# Patient Record
Sex: Male | Born: 1975 | Race: White | Hispanic: No | Marital: Married | Smoking: Former smoker
Health system: Southern US, Community
[De-identification: ages and names within clinical notes are randomized; demographics above are authoritative.]

## PROBLEM LIST (undated history)

## (undated) DIAGNOSIS — E0590 Thyrotoxicosis, unspecified without thyrotoxic crisis or storm: Secondary | ICD-10-CM

## (undated) DIAGNOSIS — J45990 Exercise induced bronchospasm: Secondary | ICD-10-CM

## (undated) DIAGNOSIS — A6920 Lyme disease, unspecified: Secondary | ICD-10-CM

## (undated) DIAGNOSIS — K589 Irritable bowel syndrome without diarrhea: Secondary | ICD-10-CM

## (undated) DIAGNOSIS — I4891 Unspecified atrial fibrillation: Secondary | ICD-10-CM

## (undated) DIAGNOSIS — G4730 Sleep apnea, unspecified: Secondary | ICD-10-CM

## (undated) HISTORY — DX: Irritable bowel syndrome, unspecified: K58.9

## (undated) HISTORY — PX: OTHER SURGICAL HISTORY: SHX169

## (undated) HISTORY — DX: Exercise induced bronchospasm: J45.990

## (undated) HISTORY — DX: Thyrotoxicosis, unspecified without thyrotoxic crisis or storm: E05.90

## (undated) HISTORY — DX: Sleep apnea, unspecified: G47.30

---

## 2011-08-25 ENCOUNTER — Ambulatory Visit (INDEPENDENT_AMBULATORY_CARE_PROVIDER_SITE_OTHER): Payer: BC Managed Care – PPO

## 2011-08-25 DIAGNOSIS — J029 Acute pharyngitis, unspecified: Secondary | ICD-10-CM

## 2011-11-25 ENCOUNTER — Ambulatory Visit: Payer: BC Managed Care – PPO

## 2011-12-12 ENCOUNTER — Ambulatory Visit (INDEPENDENT_AMBULATORY_CARE_PROVIDER_SITE_OTHER): Payer: BC Managed Care – PPO | Admitting: Family Medicine

## 2011-12-12 VITALS — BP 124/79 | HR 61 | Temp 97.6°F | Resp 18 | Ht 69.5 in | Wt 192.6 lb

## 2011-12-12 DIAGNOSIS — L989 Disorder of the skin and subcutaneous tissue, unspecified: Secondary | ICD-10-CM

## 2011-12-12 LAB — POCT SKIN KOH: Skin KOH, POC: NEGATIVE

## 2011-12-12 MED ORDER — CLOTRIMAZOLE-BETAMETHASONE 1-0.05 % EX CREA: TOPICAL_CREAM | Freq: Two times a day (BID) | CUTANEOUS | Status: DC

## 2011-12-12 NOTE — Progress Notes (Signed)
  Subjective:    Patient ID: Noah Bell, male    DOB: 10/20/1975, 36 y.o.   MRN: [id number]  HPI 36 yo male with spot on lower, outer right leg.  There for several months, enlarged recently.  Even noticing a new spot just proximal to it.  Initially started very small like a bite.  Very itchy.  Has tried tea tree oil, hydrocortisone cream, and lamisil.  All make it tingle/burn at first.  Lamisil helped the itch, though it did enlarge after he used it.  Works at [employer].   Review of Systems    Negative except as per HPI  Objective:   Physical Exam  Constitutional: He appears well-developed and well-nourished.  Pulmonary/Chest: Effort normal.  Neurological: He is alert.  Skin:          1 - red, circular, flat lesion with some central clearing.  Dry, scaly skin.  Very small spot of similar appearance - red and scaly, just proximal.     Results for orders placed in visit on 12/12/11  POCT SKIN KOH      Component Value Range   Skin KOH, POC Negative           Assessment & Plan:  Skin lesion - fungal vs eczematous.  Recent use of both hydrocortisone and lamisil may have altered appearance and testing.  Try lotrisone.  If no resolution in 7-10 days - to dermatology.

## 2012-01-14 DIAGNOSIS — A6920 Lyme disease, unspecified: Secondary | ICD-10-CM

## 2012-01-14 HISTORY — DX: Lyme disease, unspecified: A69.20

## 2012-01-18 ENCOUNTER — Telehealth: Payer: Self-pay

## 2012-01-18 DIAGNOSIS — L989 Disorder of the skin and subcutaneous tissue, unspecified: Secondary | ICD-10-CM

## 2012-01-18 NOTE — Telephone Encounter (Signed)
Pt would like to talk with someone about recent visit and if not better schedule a referral with a dermatologist  Best number [phone]

## 2012-01-18 NOTE — Telephone Encounter (Signed)
Yes, if he is no better we should refer to dermatology. Is this what he would like [place] to do?

## 2012-01-19 NOTE — Telephone Encounter (Signed)
Spoke w/pt and the lesion has not significantly improved and he has continued to use the cream we Rxd. Pt would like referral to dermatologist. Starting referral per Heather's note.

## 2012-01-28 ENCOUNTER — Ambulatory Visit: Payer: BC Managed Care – PPO | Admitting: Physician Assistant

## 2012-01-28 VITALS — BP 131/85 | HR 94 | Temp 98.4°F | Resp 16 | Ht 69.5 in | Wt 193.0 lb

## 2012-01-28 DIAGNOSIS — L989 Disorder of the skin and subcutaneous tissue, unspecified: Secondary | ICD-10-CM

## 2012-01-28 DIAGNOSIS — R21 Rash and other nonspecific skin eruption: Secondary | ICD-10-CM

## 2012-01-28 LAB — POCT CBC
Granulocyte percent: 54.5 %G (ref 37–80)
HCT, POC: 45.7 % (ref 43.5–53.7)
MCV: 86.9 fL (ref 80–97)
MID (cbc): 0.7 (ref 0–0.9)
Platelet Count, POC: 212 10*3/uL (ref 142–424)
RBC: 5.26 M/uL (ref 4.69–6.13)

## 2012-01-28 MED ORDER — DOXYCYCLINE HYCLATE 100 MG PO TABS: 100.0000 mg | ORAL_TABLET | Freq: Two times a day (BID) | ORAL | Status: AC

## 2012-01-28 NOTE — Progress Notes (Signed)
  Subjective:    Patient ID: Noah Bell, male    DOB: 10/21/1975, 36 y.o.   MRN: [id number]  HPI Noah Bell comes in today complaining of a rash or bug bite on his right shoulder that he noticed 6 days ago.  Initially when he noticed lesion it was red and the size of a dime.  The central red area decreased in size but he noticed a large thin red ring around central portion today.  He does not remember being bitten by an insect and denies tick exposure.  He has had no fever or chills, nausea or vomiting, myalgias, arthralgias or HA.  He says that the rash is slightly itchy.   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 Vital signs are normal. He appears well-developed and well-nourished.  Eyes: No scleral icterus.  Cardiovascular: Normal rate and regular rhythm.   Pulmonary/Chest: Effort normal and breath sounds normal.  Lymphadenopathy:    He has no cervical adenopathy.    He has no axillary adenopathy.  Neurological: He is alert and oriented to person, place, and time.  Skin:          See photo       Results for orders placed in visit on 01/28/12  POCT CBC      Component Value Range   WBC 6.0  4.6 - 10.2 K/uL   Lymph, poc 2.0  0.6 - 3.4   POC LYMPH PERCENT 34.0  10 - 50 %L   MID (cbc) 0.7  0 - 0.9   POC MID % 11.5  0 - 12 %M   POC Granulocyte 3.3  2 - 6.9   Granulocyte percent 54.5  37 - 80 %G   RBC 5.26  4.69 - 6.13 M/uL   Hemoglobin 14.6  14.1 - 18.1 g/dL   HCT, POC 29.5  28.4 - 53.7 %   MCV 86.9  80 - 97 fL   MCH, POC 27.8  27 - 31.2 pg   MCHC 31.9  31.8 - 35.4 g/dL   RDW, POC 13.2     Platelet Count, POC 212  142 - 424 K/uL   MPV 8.8  0 - 99.8 fL       Assessment & Plan:  Rash, ? Insect bite vs Erythema Migrans  Check Lyme titer Start Doxy 100 Handout given for red flag symptoms.

## 2012-01-28 NOTE — Patient Instructions (Addendum)
Lyme Disease  You may have been bitten by a tick and are to watch for the development of Lyme Disease. Lyme Disease is an infection that is caused by a bacteria The bacteria causing this disease is named Borreilia burgdorferi. If a tick is infected with this bacteria and then bites you, then Lyme Disease may occur. These ticks are carried by deer and rodents such as rabbits and mice and infest grassy as well as forested areas. Fortunately most tick bites do not cause Lyme Disease.   Lyme Disease is easier to prevent than to treat. First, covering your legs with clothing when walking in areas where ticks are possibly abundant will prevent their attachment because ticks tend to stay within inches of the ground. Second, using insecticides containing DEET can be applied on skin or clothing. Last, because it takes about 12 to 24 hours for the tick to transmit the disease after attachment to the human host, you should inspect your body for ticks twice a day when you are in areas where Lyme Disease is common. You must look thoroughly when searching for ticks. The Ixodes tick that carries Lyme Disease is very small. It is around the size of a sesame seed (picture of tick is not actual size). Removal is best done by grasping the tick by the head and pulling it out. Do not to squeeze the body of the tick. This could inject the infecting bacteria into the bite site. Wash the area of the bite with an antiseptic solution after removal.   Lyme Disease is a disease that may affect many body systems. Because of the small size of the biting tick, most people do not notice being bitten. The first sign of an infection is usually a round red rash that extends out from the center of the tick bite. The center of the lesion may be blood colored (hemorrhagic) or have tiny blisters (vesicular). Most lesions have bright red outer borders and partial central clearing. This rash may extend out many inches in diameter, and multiple lesions may  be present. Other symptoms such as fatigue, headaches, chills and fever, general achiness and swelling of lymph glands may also occur. If this first stage of the disease is left untreated, these symptoms may gradually resolve by themselves, or progressive symptoms may occur because of spread of infection to other areas of the body.   Follow up with your caregiver to have testing and treatment if you have a tick bite and you develop any of the above complaints. Your caregiver may recommend preventative (prophylactic) medications which kill bacteria (antibiotics). Once a diagnosis of Lyme Disease is made, antibiotic treatment is highly likely to cure the disease. Effective treatment of late stage Lyme Disease may require longer courses of antibiotic therapy.   MAKE SURE YOU:    Understand these instructions.   Will watch your condition.   Will get help right away if you are not doing well or get worse.  Document Released: 11/07/2000 Document Revised: 07/21/2011 Document Reviewed: 01/09/2009  Exit[employer] Patient Information 2012 [employer].

## 2012-01-30 LAB — B. BURGDORFI ANTIBODIES: B burgdorferi Ab IgG+IgM: 1.31 {ISR} — ABNORMAL HIGH

## 2012-01-31 LAB — B. BURGDORFI ANTIBODIES BY WB
B burgdorferi IgG Abs (IB): NEGATIVE
B burgdorferi IgM Abs (IB): NEGATIVE

## 2012-07-01 DIAGNOSIS — I4891 Unspecified atrial fibrillation: Secondary | ICD-10-CM

## 2012-07-01 HISTORY — DX: Unspecified atrial fibrillation: I48.91

## 2012-07-02 ENCOUNTER — Encounter (HOSPITAL_COMMUNITY): Payer: Self-pay

## 2012-07-02 ENCOUNTER — Ambulatory Visit (INDEPENDENT_AMBULATORY_CARE_PROVIDER_SITE_OTHER): Payer: BC Managed Care – PPO | Admitting: Family Medicine

## 2012-07-02 ENCOUNTER — Observation Stay (HOSPITAL_COMMUNITY)
Admission: EM | Admit: 2012-07-02 | Discharge: 2012-07-02 | Disposition: A | Discharge status: Home / Self Care | Payer: BC Managed Care – PPO | Attending: Emergency Medicine | Admitting: Emergency Medicine

## 2012-07-02 VITALS — BP 106/73 | HR 93 | Temp 97.9°F | Resp 16 | Ht 69.4 in | Wt 194.6 lb

## 2012-07-02 DIAGNOSIS — R0602 Shortness of breath: Secondary | ICD-10-CM

## 2012-07-02 DIAGNOSIS — R450 Nervousness: Secondary | ICD-10-CM | POA: Insufficient documentation

## 2012-07-02 DIAGNOSIS — F411 Generalized anxiety disorder: Secondary | ICD-10-CM | POA: Insufficient documentation

## 2012-07-02 DIAGNOSIS — I4891 Unspecified atrial fibrillation: Principal | ICD-10-CM | POA: Insufficient documentation

## 2012-07-02 DIAGNOSIS — R42 Dizziness and giddiness: Secondary | ICD-10-CM | POA: Insufficient documentation

## 2012-07-02 LAB — CBC WITH DIFFERENTIAL/PLATELET
Basophils Absolute: 0 10*3/uL (ref 0.0–0.1)
Basophils Relative: 0 % (ref 0–1)
Eosinophils Absolute: 0.3 10*3/uL (ref 0.0–0.7)
Eosinophils Relative: 5 % (ref 0–5)
HCT: 43.3 % (ref 39.0–52.0)
Hemoglobin: 15 g/dL (ref 13.0–17.0)
Lymphocytes Relative: 35 % (ref 12–46)
Lymphs Abs: 2.4 10*3/uL (ref 0.7–4.0)
MCH: 28.3 pg (ref 26.0–34.0)
MCHC: 34.6 g/dL (ref 30.0–36.0)
MCV: 81.7 fL (ref 78.0–100.0)
Monocytes Absolute: 0.5 10*3/uL (ref 0.1–1.0)
Monocytes Relative: 8 % (ref 3–12)
Neutro Abs: 3.6 10*3/uL (ref 1.7–7.7)
Neutrophils Relative %: 52 % (ref 43–77)
Platelets: 160 10*3/uL (ref 150–400)
RBC: 5.3 MIL/uL (ref 4.22–5.81)
RDW: 12 % (ref 11.5–15.5)
WBC: 6.8 10*3/uL (ref 4.0–10.5)

## 2012-07-02 LAB — BASIC METABOLIC PANEL WITH GFR
BUN: 14 mg/dL (ref 6–23)
CO2: 23 meq/L (ref 19–32)
Calcium: 9.3 mg/dL (ref 8.4–10.5)
Chloride: 106 meq/L (ref 96–112)
Creatinine, Ser: 0.8 mg/dL (ref 0.50–1.35)
GFR calc Af Amer: >90 mL/min
GFR calc non Af Amer: >90 mL/min
Glucose, Bld: 95 mg/dL (ref 70–99)
Potassium: 3.9 meq/L (ref 3.5–5.1)
Sodium: 140 meq/L (ref 135–145)

## 2012-07-02 LAB — T4, FREE: Free T4: 1.73 ng/dL (ref 0.80–1.80)

## 2012-07-02 LAB — TSH: TSH: 0.009 u[IU]/mL — ABNORMAL LOW (ref 0.350–4.500)

## 2012-07-02 MED ORDER — FLECAINIDE ACETATE 100 MG PO TABS
300.0000 mg | ORAL_TABLET | Freq: Once | ORAL | Status: AC
  Administered 2012-07-02: 300 mg via ORAL
  Filled 2012-07-02: qty 3

## 2012-07-02 MED ORDER — METOPROLOL TARTRATE 1 MG/ML IV SOLN
5.0000 mg | Freq: Once | INTRAVENOUS | Status: AC
  Administered 2012-07-02: 5 mg via INTRAVENOUS
  Filled 2012-07-02: qty 5

## 2012-07-02 MED ORDER — DILTIAZEM HCL 100 MG IV SOLR
5.0000 mg/h | Freq: Once | INTRAVENOUS | Status: AC
  Administered 2012-07-02: 5 mg/h via INTRAVENOUS
  Filled 2012-07-02: qty 100

## 2012-07-02 MED ORDER — SODIUM CHLORIDE 0.9 % IV BOLUS (SEPSIS)
1000.0000 mL | Freq: Once | INTRAVENOUS | Status: AC
  Administered 2012-07-02: 1000 mL via INTRAVENOUS

## 2012-07-02 MED ORDER — DILTIAZEM HCL 25 MG/5ML IV SOLN
20.0000 mg | Freq: Once | INTRAVENOUS | Status: AC
  Administered 2012-07-02: 20 mg via INTRAVENOUS
  Filled 2012-07-02: qty 5

## 2012-07-02 NOTE — ED Notes (Signed)
Pt sts he had heart flutters and went to urgent care.  Urgent care did an EKG and call [phone] when the results showed afib

## 2012-07-02 NOTE — Progress Notes (Signed)
[facility]:  Office Visit  Chief Complaint:  Chief Complaint  Patient presents with  . 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 He thought it would be better when he slept since attributed it to anxiety since getting married this week. Did not feel better this AM.  Micah Flesher to pharmacist, and pulse was not regular.  Does not describe it as SOB but occasionally requiring to catch breath to get good breath.  No HA, vision changes, nausea, vomiting, abd pain.  Denies CP, pedal edema. 1 x episode prior but spont. Discontinued. + minimal diaphoretic No family h/o A. Fib, MI, irregular heart rhythm.  Denies any illicit drug use.  No sick sxs recently.  + Family h/o thyroid disease  Past Medical History  Diagnosis Date  . Allergy    History reviewed. No pertinent past surgical history. History   Social History  . Marital Status: Single    Spouse Name: N/A    Number of Children: N/A  . Years of Education: N/A   Social History Main Topics  . Smoking status: Never Smoker   . Smokeless tobacco: None  . Alcohol Use: Yes     Comment: occasionlly  . Drug Use: No  . Sexually Active: Yes   Other Topics Concern  . None   Social History Narrative  . None   Family History  Problem Relation Age of Onset  . Thyroid disease Mother   . Depression Maternal Grandmother   . Depression Maternal Grandfather    No Known Allergies Prior to Admission medications   Medication Sig Start Date End Date Taking? Authorizing Provider  Multiple Vitamin (MULTIVITAMINS PO) Take 1 tablet by mouth daily.   Yes Historical Provider, MD  clotrimazole-betamethasone (LOTRISONE) cream Apply topically 2 (two) times daily. 12/12/11 12/11/12  Lamar Laundry, MD     ROS: The patient denies fevers, chills, night sweats, unintentional weight loss, chest pain, wheezing, dyspnea on  exertion, nausea, vomiting, abdominal pain, dysuria, hematuria, melena, numbness, weakness, or tingling. + irregular heart rate, palpitations.  All other systems have been reviewed and were otherwise negative with the exception of those mentioned in the HPI and as above.    PHYSICAL EXAM: Filed Vitals:   07/02/12 1332  BP: 106/73  Pulse: 93  Temp: 97.9 F (36.6 C)  Resp: 16   Filed Vitals:   07/02/12 1332  Height: 5' 9.4" (1.763 m)  Weight: 194 lb 9.6 oz (88.27 kg)   Body mass index is 28.41 kg/(m^2).  General: Alert, no acute distress HEENT:  Normocephalic, atraumatic, oropharynx patent. EOMI, PERRLA Cardiovascular:  Irregular rate and irregular rhythm, no rubs murmurs or gallops.  No Carotid bruits, radial pulse intact. No pedal edema.  Respiratory: Clear to auscultation bilaterally.  No wheezes, rales, or rhonchi.  No cyanosis, no use of accessory musculature GI: No organomegaly, abdomen is soft and non-tender, positive bowel sounds.  No masses. Skin: No rashes. Neurologic: Facial musculature symmetric. Psychiatric: Patient is appropriate throughout our interaction. Lymphatic: No cervical lymphadenopathy Musculoskeletal: Gait intact.   LABS: Results for orders placed in visit on 01/28/12  POCT CBC      Component Value Range   WBC 6.0  4.6 - 10.2 K/uL   Lymph, poc 2.0  0.6 - 3.4   POC LYMPH PERCENT 34.0  10 - 50 %L   MID (cbc) 0.7  0 - 0.9   POC MID % 11.5  0 - 12 %M   POC Granulocyte 3.3  2 - 6.9   Granulocyte percent 54.5  37 - 80 %G   RBC 5.26  4.69 - 6.13 M/uL   Hemoglobin 14.6  14.1 - 18.1 g/dL   HCT, POC 09.8  11.9 - 53.7 %   MCV 86.9  80 - 97 fL   MCH, POC 27.8  27 - 31.2 pg   MCHC 31.9  31.8 - 35.4 g/dL   RDW, POC 14.7     Platelet Count, POC 212  142 - 424 K/uL   MPV 8.8  0 - 99.8 fL  B. BURGDORFI ANTIBODIES      Component Value Range   B burgdorferi Ab IgG+IgM 1.31 (*)   B. BURGDORFI ANTIBODIES BY WB      Component Value Range   B burgdorferi IgG  Abs (IB) Negative     B burgdorferi IgM Abs (IB) Negative       EKG/XRAY:   Primary read interpreted by Dr. Conley Rolls at [facility]. A fib in RVR  At 163-161 bpm  ASSESSMENT/PLAN: Encounter Diagnoses  Name Primary?  . Shortness of breath Yes  . Atrial fibrillation with RVR     New onset A fib with RVR, no risk factors. Called ED and spoke with Lequita Halt, charge nurse, sending patient via EMS to [facility] ER.  Attempted IV but failed Will not order any labs     Nelsy Madonna PHUONG, DO 07/02/2012 2:07 PM

## 2012-07-02 NOTE — ED Provider Notes (Signed)
36 year old male noted onset last night that his heart was racing and pounding in his chest. There is no associated chest pain, dyspnea, nausea. He has not had similar problems before. On exam, his lungs are clear and heart is tachycardic and irregular without murmur. He was found to be in atrial fibrillation with a rapid ventricular response. He was initially given diltiazem with partial control of rate, but heart rate is back up to 130 240 despite diltiazem drip. He'll be given a dose of metoprolol. He has been given a dose of flecainide and attempt to cardiovert him chemically.   Date: 07/02/2012 1453  Rate: 167  Rhythm: atrial fibrillation  QRS Axis: normal  Intervals: normal  ST/T Wave abnormalities: nonspecific ST/T changes  Conduction Disutrbances:none  Narrative Interpretation: Atrial fibrillation with rapid ventricular response, minor ST and T changes which are most likely rate related. No prior ECG available for comparison.  Old EKG Reviewed: none available   Date: 07/02/2012 1522  Rate: 104  Rhythm: atrial fibrillation  QRS Axis: normal  Intervals: normal  ST/T Wave abnormalities: nonspecific ST/T changes  Conduction Disutrbances:none  Narrative Interpretation: Atrial fibrillation with rapid ventricular response and nonspecific ST and T changes. When compared with ECG of earlier today, ventricular rate has decreased to   Old EKG Reviewed: unchanged  7:12 PM Patient successfully converted to sinus rhythm. He will be discharged with followup by cardiology.   Date: 07/02/2012 1849  Rate: 75  Rhythm: normal sinus rhythm  QRS Axis: normal  Intervals: normal  ST/T Wave abnormalities: normal  Conduction Disutrbances:none  Narrative Interpretation: Normal ECG. When compared with prior ECG, and he has converted from atrial fibrillation to normal sinus rhythm.  Old EKG Reviewed: changes noted  CRITICAL CARE Performed by: ZOXWR,UEAVW   Total critical care time: 35  minutes  Critical care time was exclusive of separately billable procedures and treating other patients.  Critical care was necessary to treat or prevent imminent or life-threatening deterioration.  Critical care was time spent personally by me on the following activities: development of treatment plan with patient and/or surrogate as well as nursing, discussions with consultants, evaluation of patient's response to treatment, examination of patient, obtaining history from patient or surrogate, ordering and performing treatments and interventions, ordering and review of laboratory studies, ordering and review of radiographic studies, pulse oximetry and re-evaluation of patient's condition.   I saw and evaluated the patient, reviewed the resident's note and I agree with the findings and plan.   Dione Booze, MD 07/02/12 [phone]

## 2012-07-02 NOTE — ED Provider Notes (Signed)
Patient placed in CDU for observation. Disposition pending chemical cardioversion with flecanide.  7:08pm: Patient successfully cardioverted. I spoke with Dr. Preston Fleeting who states patient does not need medication and can follow-up with Cardiology. Discharged with return precautions.  Pixie Casino, PA-C 07/02/12 1909

## 2012-07-02 NOTE — ED Provider Notes (Signed)
History     CSN: [id number]  Arrival date & time 07/02/12  1453   First MD Initiated Contact with Patient 07/02/12 1504      Chief Complaint  Patient presents with  . Palpitations    (Consider location/radiation/quality/duration/timing/severity/associated sxs/prior treatment) HPI Comments: Patient presents with sudden onset of palpitations since yesterday evening. It persisted today and he was Evaluated at urgent care and sent over here when an EKG showed that he was in A. fib with RVR. Of note he did have an episode similar to this 7 years ago that lasted 20-25 minutes. They resolve spontaneously, he went to see a doctor about it and never received a diagnosis of anything.  Patient is a 36 y.o. male presenting with palpitations. The history is provided by the patient and medical records. No language interpreter was used.  Palpitations  This is a new problem. The current episode started yesterday. The problem occurs constantly. The problem has not changed since onset.The problem is associated with anxiety and stress. On average, each episode lasts 1 day. Associated symptoms include irregular heartbeat and shortness of breath (feels difficult to catch breath at times). Pertinent negatives include no diaphoresis, no malaise/fatigue, no chest pain, no chest pressure, no syncope, no abdominal pain, no nausea, no vomiting, no back pain, no dizziness, no weakness and no cough. He has tried nothing for the symptoms. The treatment provided no relief. Risk factors include stress and being male. His past medical history does not include anemia, heart disease, hyperthyroidism or valve disorder.    Past Medical History  Diagnosis Date  . Allergy     History reviewed. No pertinent past surgical history.  Family History  Problem Relation Age of Onset  . Thyroid disease Mother   . Depression Maternal Grandmother   . Diabetes Maternal Grandmother   . Depression Maternal Grandfather   . Diabetes  Maternal Grandfather   . Diabetes Father     History  Substance Use Topics  . Smoking status: Never Smoker   . Smokeless tobacco: Not on file  . Alcohol Use: Yes     Comment: occasionlly      Review of Systems  Constitutional: Negative for malaise/fatigue, diaphoresis, activity change and appetite change.  HENT: Negative for sore throat and neck pain.   Eyes: Negative for discharge and visual disturbance.  Respiratory: Positive for shortness of breath (feels difficult to catch breath at times). Negative for cough and choking.   Cardiovascular: Positive for palpitations. Negative for chest pain, leg swelling and syncope.  Gastrointestinal: Negative for nausea, vomiting, abdominal pain, diarrhea and constipation.  Genitourinary: Negative for dysuria and difficulty urinating.  Musculoskeletal: Negative for back pain and arthralgias.  Skin: Negative for color change and pallor.  Neurological: Positive for light-headedness (mild). Negative for dizziness, speech difficulty and weakness.  Psychiatric/Behavioral: Negative for behavioral problems and agitation. The patient is nervous/anxious.   All other systems reviewed and are negative.    Allergies  Review of patient's allergies indicates no known allergies.  Home Medications   Current Outpatient Rx  Name  Route  Sig  Dispense  Refill  . VITAMIN C 500 MG PO TABS   Oral   Take 500 mg by mouth daily.           BP 108/94  Pulse 84  Temp 98.4 F (36.9 C) (Oral)  Resp 17  SpO2 98%  Physical Exam  Constitutional: He appears well-developed. No distress.  HENT:  Head: Normocephalic and atraumatic.  Mouth/Throat:  No oropharyngeal exudate.  Eyes: EOM are normal. Pupils are equal, round, and reactive to light. Right eye exhibits no discharge. Left eye exhibits no discharge.  Neck: Normal range of motion. Neck supple. No JVD present.  Cardiovascular: Normal heart sounds.        Irregularly irregular. Rapid ventricular rate  in the 160s  Pulmonary/Chest: Effort normal and breath sounds normal. No stridor. No respiratory distress. He has no wheezes. He has no rales. He exhibits no tenderness.  Abdominal: Soft. Bowel sounds are normal. There is no tenderness. There is no guarding.  Genitourinary: Penis normal.  Musculoskeletal: Normal range of motion. He exhibits no edema and no tenderness.  Neurological: He is alert. No cranial nerve deficit. He exhibits normal muscle tone.  Skin: Skin is warm and dry. He is not diaphoretic. No erythema. No pallor.  Psychiatric: He has a normal mood and affect. His behavior is normal. Judgment and thought content normal.    ED Course  Procedures (including critical care time)  Labs Reviewed  TSH - Abnormal; Notable for the following:    TSH 0.009 (*)     All other components within normal limits  BASIC METABOLIC PANEL  T4, FREE  CBC WITH DIFFERENTIAL   No results found.   1. New onset a-fib      Date: 07/02/2012 at 1453  Rate: 167  Rhythm: AFib w/ rvr  QRS Axis: normal  Intervals: irreg irreg  ST/T Wave abnormalities: TWI in 3, flat in aVF  Conduction Disutrbances: none  Narrative Interpretation: afib w/ rvr  Old EKG Reviewed: none    Date: 07/02/2012 at 1522 after dilt 20mg   Rate: 104  Rhythm: afib  QRS Axis: normal  Intervals: normal  ST/T Wave abnormalities: normal  Conduction Disutrbances: none  Narrative Interpretation: slowed down but still AFib  Old EKG Reviewed: No significant changes noted except slower     MDM  3:16 PM new onset AFib w/ RVR, pt recalls palpitations starting last night. Sent by urgent care when he was found to have AFib w/ RVR. EKG c/w AFib w/ RVR. Unknown trigger. Denies any recent alcohol or drug use, denies any other medical problems, denies any recent caffeine intake. Will check labs and give diltiazem 20 mg and reassess. Stable.  Sent to CDU for obs. Gave 300mg  flecainide to attempt chemical cardioversion. On dilt  gtt.  TSH noted, nml fT4. Will need this followed up when he sees cardiology. I emailed him about this 07/03/12.       Warrick Parisian, MD 07/03/12 [phone]

## 2012-07-03 NOTE — ED Provider Notes (Signed)
Medical screening examination/treatment/procedure(s) were conducted as a shared visit with non-physician practitioner(s) and myself.  I personally evaluated the patient during the encounter   Aiyonna Lucado, MD 07/03/12 1633 

## 2012-07-04 ENCOUNTER — Emergency Department (HOSPITAL_COMMUNITY): Payer: BC Managed Care – PPO

## 2012-07-04 ENCOUNTER — Emergency Department (HOSPITAL_COMMUNITY)
Admission: EM | Admit: 2012-07-04 | Discharge: 2012-07-04 | Disposition: A | Discharge status: Home / Self Care | Payer: BC Managed Care – PPO | Attending: Emergency Medicine | Admitting: Emergency Medicine

## 2012-07-04 ENCOUNTER — Encounter (HOSPITAL_COMMUNITY): Payer: Self-pay | Admitting: *Deleted

## 2012-07-04 DIAGNOSIS — Z8679 Personal history of other diseases of the circulatory system: Secondary | ICD-10-CM | POA: Insufficient documentation

## 2012-07-04 DIAGNOSIS — R079 Chest pain, unspecified: Secondary | ICD-10-CM

## 2012-07-04 DIAGNOSIS — Z87891 Personal history of nicotine dependence: Secondary | ICD-10-CM | POA: Insufficient documentation

## 2012-07-04 DIAGNOSIS — R072 Precordial pain: Secondary | ICD-10-CM

## 2012-07-04 HISTORY — DX: Unspecified atrial fibrillation: I48.91

## 2012-07-04 HISTORY — DX: Lyme disease, unspecified: A69.20

## 2012-07-04 LAB — CK TOTAL AND CKMB (NOT AT ARMC)
CK, MB: 1.9 ng/mL (ref 0.3–4.0)
Relative Index: 1.2 (ref 0.0–2.5)
Total CK: 155 U/L (ref 7–232)

## 2012-07-04 LAB — CBC WITH DIFFERENTIAL/PLATELET
Eosinophils Relative: 5 % (ref 0–5)
Lymphocytes Relative: 29 % (ref 12–46)
Lymphs Abs: 1.9 10*3/uL (ref 0.7–4.0)
MCV: 81.2 fL (ref 78.0–100.0)
Neutro Abs: 3.7 10*3/uL (ref 1.7–7.7)
Platelets: 180 10*3/uL (ref 150–400)
RBC: 5.31 MIL/uL (ref 4.22–5.81)
WBC: 6.4 10*3/uL (ref 4.0–10.5)

## 2012-07-04 LAB — POCT I-STAT TROPONIN I

## 2012-07-04 LAB — BASIC METABOLIC PANEL
CO2: 29 mEq/L (ref 19–32)
Calcium: 9.5 mg/dL (ref 8.4–10.5)
Chloride: 105 mEq/L (ref 96–112)
Glucose, Bld: 83 mg/dL (ref 70–99)
Potassium: 3.8 mEq/L (ref 3.5–5.1)
Sodium: 143 mEq/L (ref 135–145)

## 2012-07-04 LAB — TROPONIN I: Troponin I: <0.3 ng/mL (ref <0.30)

## 2012-07-04 MED ORDER — ASPIRIN 81 MG PO CHEW
324.0000 mg | CHEWABLE_TABLET | Freq: Once | ORAL | Status: AC
  Administered 2012-07-04: 324 mg via ORAL
  Filled 2012-07-04: qty 4

## 2012-07-04 NOTE — ED Provider Notes (Signed)
10:43 AM  Date: 07/04/2012  Rate: 94  Rhythm: normal sinus rhythm  QRS Axis: normal  Intervals: normal  ST/T Wave abnormalities: normal  Conduction Disutrbances:none  Narrative Interpretation: Normal EKG  Old EKG Reviewed: unchanged    Carleene Cooper III, MD 07/04/12 1044

## 2012-07-04 NOTE — ED Provider Notes (Signed)
Medical screening examination/treatment/procedure(s) were conducted as a shared visit with non-physician practitioner(s) and myself.  I personally evaluated the patient during the encounter 36 yo man had new onset of rapid atrial fibrillation 2 days ago, which was converted with IV Flecanide. He had chest pain in the left upper anterior chest that night, yesterday, and this morning. It is like an ache. He rates the severity of the pain at a 2 or 3. Exam shows him to be a young man in no apparent distress. He localizes the pain to the left upper anterior chest. There is no palpable deformity. Lungs are clear. Heart sounds are normal. EKG non-acute. TNI pending. Call to [facility] to consult on him.        Carleene Cooper III, MD 07/04/12 [phone]

## 2012-07-04 NOTE — ED Notes (Signed)
PT HAS ARRIVED IN CDU TO WAIT FOR Oliver CARDS TO COME SEE HIM

## 2012-07-04 NOTE — Consult Note (Signed)
Consult Note  Patient ID: Noah Bell MRN: [id number], SOB: October 28, 1975 36 y.o. Date of Encounter: 07/04/2012, 2:28 PM  Primary Physician: [facility] Primary Cardiologist: None, scheduled for new pt appt with Dr. Johney Frame  Chief Complaint: Chest pain  HPI: 36 y.o. male with h/o Lyme disease (01/2012), but otherwise no prior significant PMHx who was seen in the ED on 11/18 with new onset atrial fibrillation w/ rvr converted with flecainide and discharged home. He now returns on 11/20 with complaints of chest pain.  In June of this year he was evaluated for a tick bite and found to have Lyme disease for which he was initially treated with antibiotics. He had recurrent symptoms, including rash for which he is currently being treated by a naturalist. Over the last two months he has been taking: Ultra Flora, Nux Vomica, Itires (imprint), mandipur, manganese somaplex, Inflamma Tone, Pleo Fort, Alka C, Allithiamine, Galt Immune, B6 phosphate, Power Caps, Chromium, Ondamed. He is only taking a few of them currently and reports his symptoms are improved. On 11/17 he noted sudden onset of palpitations and a feeling of his heart beating irregularly. This felt exactly like an episode he had in 2007 during a time of emotional stress, but while that episode lasted < 24hrs this continued until the next day prompting him to seek medical attention. He was evaluated in Urgent Care on 07/02/12 where his EKG showed A.fib 160s for which he was transported to the [facility] ED. He was placed on Diltiazem drip and later given 300mg  Flecainide with conversion to NSR. Of note TSH was markedly low at 0.009 with free T4 1.73. He was discharged from the ED with plans to see Dr. Johney Frame on 07/06/12.   On the evening he was discharged he noted chest pain when he laid down for bed. The chest pain is in the left upper chest and described as a dull pressure. No radiation. Worse with movement of upper  extremities. Worse with deep inspiration or lying on left side. No associated sob, diaphoresis, nausea, dizziness. Not exertional.  No recurrence until Tuesday night when he went to bed. This morning while taking shower he felt the pain again prompting him to present to the ED.  He reports being evaluated for the same type of chest pain in March of this year at an urgent care and was told the work up was normal and may be pericarditis. No recurrence until this week. He is not very physically active, but states he is able to walk and climb stairs without chest pain or sob. No fever, chills, syncope, change in bladder/bowels, melena/hematochezia. No prolonged immobilization/extended travel or recent surgery. Quit smoking 23yrs ago and has a <6pack yr history. Occasional ETOH. No illicit drugs. He snores "loudly" at night, but unaware of any apneic spells. He is getting married this Saturday and planning to go to [place] on Sunday.   In the ED, EKG shows NSR 94bpm no acute ST/T changes compared to 11/18 EKG. CXR is without acute cardiopulmonary abnormalities. Labs are significant for normal poc troponin and unremarkable CBC/BMET. BP and HR are stable. He is afebrile without hypoxia or tachypnea.    Past Medical History  Diagnosis Date  . Atrial fibrillation 07/01/12    converted w/ Flecainide in the ED  . Lyme disease 01/2012     Surgical History:  None  Home Meds: Medication Sig  vitamin C (ASCORBIC ACID) 500 MG tablet Take 500 mg by mouth  daily.    Allergies: No Known Allergies  History   Social History  . Marital Status: Single    Spouse Name: N/A    Number of Children: N/A  . Years of Education: N/A   Occupational History  . Not on file.   Social History Main Topics  . Smoking status: Former Smoker -- 6 years    Types: Cigarettes    Quit date: 08/15/2001  . Smokeless tobacco: Never Used  . Alcohol Use: Yes     Comment: occasionlly  . Drug Use: No  . Sexually Active: Yes    Other Topics Concern  . Not on file   Social History Narrative  . No narrative on file     Family History  Problem Relation Age of Onset  . Thyroid disease Mother   . Depression Maternal Grandmother   . Diabetes Maternal Grandmother   . Depression Maternal Grandfather   . Diabetes Maternal Grandfather   . Diabetes Father   . Sleep apnea Mother     Review of Systems: General: negative for chills, fever, night sweats or weight changes.  Cardiovascular: As per HPI Dermatological: (+) intermittent rash to bilat wrists Respiratory: negative for cough or wheezing Urologic: negative for hematuria Abdominal: negative for nausea, vomiting, diarrhea, bright red blood per rectum, melena, or hematemesis Neurologic: negative for visual changes, syncope, or dizziness All other systems reviewed and are otherwise negative except as noted above.  Labs:   Component Value Date   WBC 6.4 07/04/2012   HGB 15.6 07/04/2012   HCT 43.1 07/04/2012   MCV 81.2 07/04/2012   PLT 180 07/04/2012    Lab 07/04/12 1111  NA 143  K 3.8  CL 105  CO2 29  BUN 14  CREATININE 0.90  CALCIUM 9.5  GLUCOSE 83     07/04/2012 11:34  Troponin i, poc 0.00     07/02/2012 15:15  TSH 0.009 (L)  Free T4 1.73    Radiology/Studies:   07/04/2012 - CHEST - 2 VIEW   Findings: Trachea is midline.  Heart size normal.  Lungs are clear. No pleural fluid. There may be very minimal anterior wedging of a lower thoracic vertebral body, unchanged.  IMPRESSION: No acute findings.         EKG: 07/04/12 @ 1032 - NSR 94bpm no acute ST/T changes compared to 11/18 EKG  Physical Exam: Blood pressure 117/83, pulse 69, temperature 98.2 F (36.8 C), temperature source Oral, resp. rate 13, SpO2 97.00%. General: Well developed, white male in no acute distress. Head: Normocephalic, atraumatic, sclera non-icteric, nares are without discharge Neck: Supple. Negative for carotid bruits. No JVD Lungs: Clear bilaterally to  auscultation without wheezes, rales, or rhonchi. Breathing is unlabored. Heart: RRR with S1 S2. No murmurs, rubs, or gallops appreciated. Abdomen: Soft, non-tender, non-distended with normoactive bowel sounds. No rebound/guarding. No obvious abdominal masses. Msk:  Strength and tone appear normal for age. Extremities: No edema. No clubbing or cyanosis. Distal pedal pulses are intact and equal bilaterally. Neuro: Alert and oriented X 3. Moves all extremities spontaneously. Psych:  Responds to questions appropriately with a normal affect.    ASSESSMENT AND PLAN:  36 y.o. male with h/o Lyme disease (01/2012), but otherwise no prior significant PMHx who was seen in the ED on 11/18 with new onset atrial fibrillation w/ rvr converted with flecainide and discharged home. He now returns on 11/20 with complaints of chest pain.  1. Midsternal Chest Pain 2. Paroxysmal Atrial Fibrillation 3. Depressed TSH with  normal Free T4 4. H/o Lyme Disease  Patient presents with chest pain that started two days ago and is atypical in nature. Poc troponin normal and EKG unremarkable. He was recently diagnosed with a.fib w/ rvr that converted with flecainide. Was also diagnosed with and treated for Lyme disease 5 months ago. Do not suspect ACS. His chest pain is likely musculoskeletal in nature, however, with his recent dx of a.fib and h/o of Lyme disease will assess for PE and Pericarditis. Will check DDimer, obtain one full set of cardiac enzymes, and get an echocardiogram. Given his low TSH, but normal Free T4 will check a free T3. If the above work up is unremarkable can be dc'd from the ED and continue with outpatient visit with Dr. Johney Frame on 11/22.   Signed, HOPE, JESSICA PA-C 07/04/2012, 2:28 PM  The patient was seen in the CDU with [facility].  Agree with her assessment and plan.  Physical examination today reveals that he is in no distress.  His lungs are clear and his heart reveals no pericardial rub  gallop murmur or click.  His electrocardiogram is normal and shows no evidence of pericarditis or left ventricular strain.  It is possible that the multiple herbal remedies that he has been taking may have played a role in precipitating his paroxysmal atrial fibrillation and I would recommend avoidance of those medications going forward.  In terms of his paroxysmal atrial fibrillation he is CHADSS score of 0 and does not have an indication for long-term anticoagulation at this point.  His thyroid function studies were borderline for overactive thyroid and we will also get a T3 level today.  His chest x-ray is normal and his echocardiogram is pending.  A d-dimer is pending.  Anticipate that he can be discharged from the emergency room later this afternoon if the d-dimer and the echocardiogram are unremarkable with subsequent followup with Dr. Johney Frame as scheduled on 07/06/12.

## 2012-07-04 NOTE — ED Provider Notes (Signed)
Medical screening examination/treatment/procedure(s) were performed by non-physician practitioner and as supervising physician I was immediately available for consultation/collaboration. See my prior note.  Carleene Cooper III, MD 07/04/12 [phone]

## 2012-07-04 NOTE — ED Notes (Signed)
Pt had a period of chest pain last night that was worse when changing positions in bed. He advises it started after going to bed and he eventually went to sleep with the pain still being there.  Pt advises when he woke up he was pain free but after rolling over the pain come back.  Advises pain went away on the drive to hospital.  Currently pain free and without complaints.

## 2012-07-04 NOTE — ED Notes (Signed)
MEAL ORDERD FOR PT

## 2012-07-04 NOTE — ED Notes (Signed)
DR BRACKBILL HERE TO SEE PT

## 2012-07-04 NOTE — Progress Notes (Signed)
11:48 AM 36 yo man had new onset of rapid atrial fibrillation 2 days ago, which was converted with IV Flecanide.  He had chest pain in the left upper anterior chest that night, yesterday, and this morning.  It is like an ache.  He rates the severity of the pain at a 2 or 3.  Exam shows him to be a young man in no apparent distress.  He localizes the pain to the left upper anterior chest.  There is no palpable deformity.  Lungs are clear.  Heart sounds are normal.  EKG non-acute.  TNI pending.  Call to [facility] to consult on him.

## 2012-07-04 NOTE — ED Provider Notes (Signed)
History     CSN: [id number]  Arrival date & time 07/04/12  1026   First MD Initiated Contact with Patient 07/04/12 1059      Chief Complaint  Patient presents with  . Chest Pain    (Consider location/radiation/quality/duration/timing/severity/associated sxs/prior treatment) HPI Comments: Patient with a history of atrial fibrillation presents today with a chief complaint of chest pain.  He reports that he has been having the pain intermittently since Monday evening.  Monday evening he was seen in the ED and found to have atrial fibrillation with RVR, which was a new diagnosis.  He was cardioverted with Diltiazem and discharged home and given Cardiology follow up.  He has not yet seen Cardiology.  He again had another episode of chest pain yesterday.  This morning he had another episode of chest pain while in the shower.  Chest pain lasted approximately one hour and then resolved without intervention.  He described the pain as a dull pressure and states that the pain worsened with different positions.  He denies nausea, vomiting, diaphoresis, or shortness of breath associated with the pain.  No chest pain at this time.  No prior cardiac history other than the a fib.  No history of HTN, DM, or Hyperlipidemia.  No family history of cardiac disease.  No prolonged travel or surgeries in the past 4 weeks.  No LE edema, pain, or erythema.  No prior history of DVT or PE.  No prior history of Cancer.  The history is provided by the patient.    Past Medical History  Diagnosis Date  . Allergy   . Afib     History reviewed. No pertinent past surgical history.  Family History  Problem Relation Age of Onset  . Thyroid disease Mother   . Depression Maternal Grandmother   . Diabetes Maternal Grandmother   . Depression Maternal Grandfather   . Diabetes Maternal Grandfather   . Diabetes Father     History  Substance Use Topics  . Smoking status: Former Games developer  . Smokeless tobacco: Not on file    . Alcohol Use: Yes     Comment: occasionlly      Review of Systems  Constitutional: Negative for fever and chills.  Respiratory: Negative for cough and shortness of breath.   Cardiovascular: Positive for chest pain. Negative for palpitations and leg swelling.  Gastrointestinal: Negative for nausea, vomiting and abdominal pain.  Neurological: Negative for dizziness, syncope and light-headedness.  All other systems reviewed and are negative.    Allergies  Review of patient's allergies indicates no known allergies.  Home Medications   Current Outpatient Rx  Name  Route  Sig  Dispense  Refill  . VITAMIN C 500 MG PO TABS   Oral   Take 500 mg by mouth daily.           BP 139/93  Pulse 93  Temp 98.2 F (36.8 C) (Oral)  Resp 16  SpO2 97%  Physical Exam  Nursing note and vitals reviewed. Constitutional: He appears well-developed and well-nourished. No distress.  HENT:  Head: Normocephalic and atraumatic.  Mouth/Throat: Oropharynx is clear and moist.  Neck: Normal range of motion. Neck supple.  Cardiovascular: Normal rate, regular rhythm, normal heart sounds and intact distal pulses.   No murmur heard. Pulmonary/Chest: Effort normal and breath sounds normal. No respiratory distress. He has no wheezes. He has no rales. He exhibits no tenderness.  Abdominal: Soft. There is no tenderness.  Musculoskeletal:  No LE edema  Neurological: He is alert.  Skin: Skin is warm and dry. He is not diaphoretic.  Psychiatric: He has a normal mood and affect.    ED Course  Procedures (including critical care time)   Labs Reviewed  CBC WITH DIFFERENTIAL  BASIC METABOLIC PANEL   Dg Chest 2 View  07/04/2012  *RADIOLOGY REPORT*  Clinical Data: Left chest pain and pressure.  Shortness of breath.  CHEST - 2 VIEW  Comparison: 07/12/2011.  Findings: Trachea is midline.  Heart size normal.  Lungs are clear. No pleural fluid. There may be very minimal anterior wedging of a lower  thoracic vertebral body, unchanged.  IMPRESSION: No acute findings.   Original Report Authenticated By: Leanna Battles, M.D.      No diagnosis found.  11:48 AM Dr. Ignacia Palma has discussed with [facility].  They report that they will come see the patient in the ED.  3:23 PM Patient signed out to [facility], PA-C in the CDU.  [facility] has evaluated the patient and recommends getting a T3, D-dimer, full set of cardiac enzymes, and a 2D Echo.  They report that they will call and discuss results and disposition once everything comes back. MDM  Patient presenting with intermittent chest pain that has been present for the past 3 days.  Pain worse with certain movements and positions.  Patient was found to have a fib with RVR in the ED 2 days ago that was cardioverted with Diltiazem.  Patient in NSR today.   Today normal EKG.  Initial troponin negative.  CXR negative.  Patient has been evaluated by [facility] while in the ED.        Pascal Lux [place], PA-C 07/04/12 1526

## 2012-07-04 NOTE — ED Provider Notes (Signed)
3:00 PM Handoff from [facility]. Patient from [facility] who has been seen by [facility] cardiology. Recently seen in ED for new onset afib with RVR. Currently pending 2D Echo.   5:39 PM Spoke with [facility] PA Alinda Money and have reviewed Echo findings. No pericarditis. Patient is cleared for d/c to home. Patient seen. Urged to f/u with cardiologist as scheduled. Patient urged to return with worsening symptoms or other concerns. Patient verbalized understanding and agrees with plan.    Renne Crigler, [place] 07/04/12 1740

## 2012-07-04 NOTE — Progress Notes (Signed)
Echocardiogram 2D Echocardiogram has been performed.  Noah Bell 07/04/2012, 4:37 PM

## 2012-07-04 NOTE — ED Notes (Signed)
PT STATES THE [facility] PA HAS SEEN HIM AND WAS WAITING TO SPEAK WITH CARDIOLOGIST ABOUT POSSIBLY ORDERING A 2D ECHO TODAY. CLEARING PT TO GO [place] OF THE [place] FOR HIS [place] THIS WEEKEND.

## 2012-07-06 ENCOUNTER — Encounter: Payer: Self-pay | Admitting: Internal Medicine

## 2012-07-06 ENCOUNTER — Ambulatory Visit (INDEPENDENT_AMBULATORY_CARE_PROVIDER_SITE_OTHER): Payer: BC Managed Care – PPO | Admitting: Internal Medicine

## 2012-07-06 VITALS — BP 136/89 | HR 102 | Ht 69.4 in | Wt 195.0 lb

## 2012-07-06 DIAGNOSIS — I4891 Unspecified atrial fibrillation: Secondary | ICD-10-CM

## 2012-07-06 MED ORDER — DILTIAZEM HCL 60 MG PO TABS: 60.0000 mg | ORAL_TABLET | Freq: Four times a day (QID) | ORAL | Status: DC

## 2012-07-06 MED ORDER — FLECAINIDE ACETATE 100 MG PO TABS: ORAL_TABLET | ORAL | Status: DC

## 2012-07-06 NOTE — Patient Instructions (Addendum)
Your physician recommends that you schedule a follow-up appointment in: 2 months with Dr Johney Frame   Your physician has recommended you make the following change in your medication:  1) Cardizem 60mg  ---take 1 every 6 hours as needed for afib 2) Flecainide 300mg  ----take 1 by mouth when you go into afib---not to be repeated unless it has been 24 hours Start with the Cardizem if you do not convert within take the Flecainide

## 2012-07-06 NOTE — Progress Notes (Signed)
   Primary Care Physician: Sheila Oats, MD Referring Physician:  Dr Ruel Favors is a 36 y.o. male with a h/o paroxsymal atrial fibrillation who presents today for EP consultation.  He reports initially having an episode of atrial fibrillation in 2007 at the time of a divorce.  He did well without recurrence until earlier this week when he had abrupt onset of tachypalpitations at home with his fiance.  He presented and was found to have afib with RVR which converted with flecainide 300mg  po x 1.  He has had no further afib since that time.  He is planned to get married this weekend and is very anxious about this.  He is unaware of any other triggers or precipitants.  Today, he denies symptoms of chest pain, shortness of breath, orthopnea, PND, lower extremity edema, dizziness, presyncope, syncope, or neurologic sequela. The patient is tolerating medications without difficulties and is otherwise without complaint today.   Past Medical History  Diagnosis Date  . Atrial fibrillation 07/01/12    converted w/ Flecainide in the ED  . Lyme disease 01/2012  . Exercise-induced asthma   . Irritable bowel syndrome    Past Surgical History  Procedure Date  . None     No current outpatient prescriptions on file.    No Known Allergies  History   Social History  . Marital Status: Single    Spouse Name: N/A    Number of Children: N/A  . Years of Education: N/A   Occupational History  . Not on file.   Social History Main Topics  . Smoking status: Former Smoker -- 6 years    Types: Cigarettes    Quit date: 08/15/2001  . Smokeless tobacco: Never Used  . Alcohol Use: Yes     Comment: occasionlly (1-2 drinks per week)  . Drug Use: No  . Sexually Active: Yes   Other Topics Concern  . Not on file   Social History Narrative   Lives in [place].  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 Thyroid disease Mother   .  Depression Maternal Grandmother   . Diabetes Maternal Grandmother   . Depression Maternal Grandfather   . Diabetes Maternal Grandfather   . Diabetes Father   . Sleep apnea Mother     ROS- All systems are reviewed and negative except as per the HPI above  Physical Exam: Filed Vitals:   07/06/12 0843  BP: 136/89  Pulse: 102  Height: 5' 9.4" (1.763 m)  Weight: 195 lb (88.451 kg)    GEN- The patient is well appearing, alert and oriented x 3 today.   Head- normocephalic, atraumatic Eyes-  Sclera clear, conjunctiva pink Ears- hearing intact Oropharynx- clear Neck- supple, no JVP Lymph- no cervical lymphadenopathy Lungs- Clear to ausculation bilaterally, normal work of breathing Heart- Regular rate and rhythm, no murmurs, rubs or gallops, PMI not laterally displaced GI- soft, NT, ND, + BS Extremities- no clubbing, cyanosis, or edema MS- no significant deformity or atrophy Skin- no rash or lesion Psych- euthymic mood, full affect Neuro- strength and sensation are intact  EKG today reveals sinus rhythm 83 bpm, PR 130, otherwwise normal ekg Echo 07/04/12- EF 55-60%, LA 37mm  Assessment and Plan:

## 2012-07-08 DIAGNOSIS — I4891 Unspecified atrial fibrillation: Secondary | ICD-10-CM | POA: Insufficient documentation

## 2012-07-08 NOTE — Assessment & Plan Note (Signed)
The patient has symptomatic paroxysmal atrial fibrillation.  Episodes are very infrequent.  He has no evidence of structural heart disease by echo.  He does appear to have hyperthyroidism for which he needs to follow-up with primary care.  It may be that hyperthyroidism has contributed to this episode. Therapeutic strategies for afib including medicine and ablation were discussed in detail with the patient today. He would like to avoid EPS presently. I think that he should have his thyroid function treated by his PCP.  In addition, I will give cardizem 60mg  q6 hours prn should he develop afib as well as flecainide which we will use as a "pill in pocket" medicine. He will return in 2-3 months for further follow-up.

## 2012-08-03 ENCOUNTER — Encounter: Payer: Self-pay | Admitting: Family Medicine

## 2012-08-03 ENCOUNTER — Ambulatory Visit (INDEPENDENT_AMBULATORY_CARE_PROVIDER_SITE_OTHER): Payer: BC Managed Care – PPO | Admitting: Family Medicine

## 2012-08-03 VITALS — BP 137/76 | HR 74 | Temp 97.9°F | Resp 16 | Ht 71.0 in | Wt 198.0 lb

## 2012-08-03 DIAGNOSIS — Z8619 Personal history of other infectious and parasitic diseases: Secondary | ICD-10-CM | POA: Insufficient documentation

## 2012-08-03 DIAGNOSIS — M2550 Pain in unspecified joint: Secondary | ICD-10-CM

## 2012-08-03 DIAGNOSIS — Z8349 Family history of other endocrine, nutritional and metabolic diseases: Secondary | ICD-10-CM

## 2012-08-03 DIAGNOSIS — I4891 Unspecified atrial fibrillation: Secondary | ICD-10-CM

## 2012-08-03 DIAGNOSIS — E0590 Thyrotoxicosis, unspecified without thyrotoxic crisis or storm: Secondary | ICD-10-CM

## 2012-08-03 LAB — T4, FREE: Free T4: 1.53 ng/dL (ref 0.80–1.80)

## 2012-08-03 NOTE — Progress Notes (Signed)
  Subjective:    Patient ID: Noah Bell, male    DOB: 07-07-1976, 36 y.o.   MRN: [id number]  HPI  This 36 y.o. Cauc male had new-onset Atrial Fib in November, requiring hospitalization, full  cardiac eval and medications. Lab tests revealed abnormal thyroid function. Pt is here today for   further work-up. He has a family hx of thyroid disorder (mother). His cardiac condition is controlled;  he has infrequent abnormal sensation in his chest that prompts him to check his pulse. He  describes this as "an uncomfortable feeling". He rarely consumes caffeine or alcohol. He was  treated for Lyme disease June 2013; shortly afterwards, he started taking supplements prescribed  by a "holistic" practitioner (he discontinued these as recommended by Cardiologist).   Pt also c/o stiff joints w/ wrist,knee and ankle pain after prolonged sitting. Redness or joint  effusions have not been observed. He denies fever, abnormal weight change or changes in skin or  hair. No new rashes or pallor.      Review of Systems  Constitutional: Negative.   HENT: Negative for trouble swallowing, neck pain, neck stiffness and voice change.   Eyes: Negative.   Respiratory: Negative.   Cardiovascular: Negative for chest pain and leg swelling.  Gastrointestinal: Negative.   Musculoskeletal: Positive for arthralgias. Negative for myalgias and joint swelling.  Neurological: Negative.   Psychiatric/Behavioral: Negative.        Objective:   Physical Exam  Nursing note and vitals reviewed. Constitutional: He is oriented to person, place, and time. Vital signs are normal. He appears well-developed and well-nourished.  HENT:  Head: Normocephalic and atraumatic.  Right Ear: Hearing, tympanic membrane, external ear and ear canal normal.  Left Ear: Hearing, tympanic membrane, external ear and ear canal normal.  Nose: Nose normal. No nasal deformity or septal deviation.  Mouth/Throat: Uvula is midline, oropharynx is clear  and moist and mucous membranes are normal. No oral lesions. Normal dentition.  Eyes: Conjunctivae normal and EOM are normal. Pupils are equal, round, and reactive to light. No scleral icterus.  Neck: Normal range of motion. Neck supple. No thyromegaly present.  Cardiovascular: Normal rate, regular rhythm, normal heart sounds and intact distal pulses.  Exam reveals no gallop and no friction rub.   No murmur heard. Pulmonary/Chest: Effort normal and breath sounds normal. No respiratory distress.  Musculoskeletal: Normal range of motion. He exhibits no edema.  Lymphadenopathy:    He has no cervical adenopathy.  Neurological: He is alert and oriented to person, place, and time. He has normal reflexes. No cranial nerve deficit. He exhibits normal muscle tone. Coordination normal.  Skin: Skin is warm and dry. No rash noted. No erythema. No pallor.  Psychiatric: He has a normal mood and affect. His behavior is normal. Judgment and thought content normal.          Assessment & Plan:   1. Hyperthyroidism  TSH, T3, Free, T4, Free, Ambulatory referral to Endocrinology  2. Joint pain  Sedimentation Rate, Vitamin D, 25-hydroxy  3. Atrial fibrillation  Ambulatory referral to Endocrinology  4. History of Lyme disease    5. Family history of thyroid disease

## 2012-08-03 NOTE — Patient Instructions (Signed)
A referral has been ordered for Endocrinology evaluation - this specialist can determine what may he causing your thyroid gland to be overactive and will discuss treatment options with you.

## 2012-08-04 LAB — VITAMIN D 25 HYDROXY (VIT D DEFICIENCY, FRACTURES): Vit D, 25-Hydroxy: 50 ng/mL (ref 30–89)

## 2012-08-06 ENCOUNTER — Encounter: Payer: Self-pay | Admitting: Family Medicine

## 2012-09-08 ENCOUNTER — Encounter: Payer: Self-pay | Admitting: Family Medicine

## 2012-09-09 ENCOUNTER — Other Ambulatory Visit: Payer: Self-pay | Admitting: Family Medicine

## 2012-09-09 DIAGNOSIS — T7840XA Allergy, unspecified, initial encounter: Secondary | ICD-10-CM

## 2012-09-10 ENCOUNTER — Ambulatory Visit: Payer: BC Managed Care – PPO | Admitting: Internal Medicine

## 2012-09-12 ENCOUNTER — Other Ambulatory Visit (HOSPITAL_COMMUNITY): Payer: Self-pay | Admitting: Endocrinology

## 2012-09-12 DIAGNOSIS — E0590 Thyrotoxicosis, unspecified without thyrotoxic crisis or storm: Secondary | ICD-10-CM

## 2012-09-17 ENCOUNTER — Encounter (HOSPITAL_COMMUNITY): Payer: Self-pay

## 2012-09-17 ENCOUNTER — Encounter (HOSPITAL_COMMUNITY)
Admission: RE | Admit: 2012-09-17 | Discharge: 2012-09-17 | Disposition: A | Discharge status: Home / Self Care | Payer: BC Managed Care – PPO | Source: Ambulatory Visit | Attending: Endocrinology | Admitting: Endocrinology

## 2012-09-17 DIAGNOSIS — E0590 Thyrotoxicosis, unspecified without thyrotoxic crisis or storm: Secondary | ICD-10-CM | POA: Insufficient documentation

## 2012-09-17 MED ORDER — SODIUM IODIDE I 131 CAPSULE
9.0000 | Freq: Once | INTRAVENOUS | Status: AC | PRN
  Administered 2012-09-17: 9 via ORAL

## 2012-09-18 ENCOUNTER — Encounter (HOSPITAL_COMMUNITY)
Admission: RE | Admit: 2012-09-18 | Discharge: 2012-09-18 | Disposition: A | Discharge status: Home / Self Care | Payer: BC Managed Care – PPO | Source: Ambulatory Visit | Attending: Endocrinology | Admitting: Endocrinology

## 2012-09-18 MED ORDER — SODIUM PERTECHNETATE TC 99M INJECTION
10.0000 | Freq: Once | INTRAVENOUS | Status: AC | PRN
  Administered 2012-09-18: 9.8 via INTRAVENOUS

## 2012-09-29 ENCOUNTER — Other Ambulatory Visit: Payer: Self-pay

## 2012-10-01 ENCOUNTER — Ambulatory Visit: Payer: BC Managed Care – PPO | Admitting: Internal Medicine

## 2012-10-05 ENCOUNTER — Other Ambulatory Visit (HOSPITAL_COMMUNITY): Payer: Self-pay | Admitting: Endocrinology

## 2012-10-05 DIAGNOSIS — E0590 Thyrotoxicosis, unspecified without thyrotoxic crisis or storm: Secondary | ICD-10-CM

## 2012-10-12 ENCOUNTER — Encounter (HOSPITAL_COMMUNITY)
Admission: RE | Admit: 2012-10-12 | Discharge: 2012-10-12 | Disposition: A | Discharge status: Home / Self Care | Payer: BC Managed Care – PPO | Source: Ambulatory Visit | Attending: Endocrinology | Admitting: Endocrinology

## 2012-10-12 DIAGNOSIS — E0590 Thyrotoxicosis, unspecified without thyrotoxic crisis or storm: Secondary | ICD-10-CM | POA: Insufficient documentation

## 2012-10-12 MED ORDER — SODIUM IODIDE I 131 CAPSULE
32.3000 | Freq: Once | INTRAVENOUS | Status: AC | PRN
  Administered 2012-10-12: 32.3 via ORAL

## 2012-10-26 ENCOUNTER — Encounter: Payer: Self-pay | Admitting: Internal Medicine

## 2012-10-26 ENCOUNTER — Ambulatory Visit (INDEPENDENT_AMBULATORY_CARE_PROVIDER_SITE_OTHER): Payer: BC Managed Care – PPO | Admitting: Internal Medicine

## 2012-10-26 VITALS — BP 111/90 | HR 91 | Ht 70.0 in | Wt 204.6 lb

## 2012-10-26 DIAGNOSIS — I4891 Unspecified atrial fibrillation: Secondary | ICD-10-CM

## 2012-10-26 NOTE — Patient Instructions (Addendum)
Your physician recommends that you schedule a follow-up appointment in: 4 months with Dr Allred  

## 2012-11-11 ENCOUNTER — Encounter: Payer: Self-pay | Admitting: Internal Medicine

## 2012-11-11 NOTE — Assessment & Plan Note (Signed)
Doing very well with treatment of underlying hyperthyroidism He will take flecainide only as needed for afib. He will follow closely with Dr Talmage Nap and I will see again in 4 months.

## 2012-11-11 NOTE — Progress Notes (Signed)
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  Since last being seen in our clinic, the patient reports doing very well.   He has been diagnosed with hyperthyroidism and has undergone treatment under the direction of Dr Talmage Nap with radioactive iodine.  Since that time, his afib has resolved.  He is doing well.  Today, he denies symptoms of palpitations, chest pain, shortness of breath,  lower extremity edema, dizziness, presyncope, or syncope.  The patient is otherwise without complaint today.   Past Medical History  Diagnosis Date  . Atrial fibrillation 07/01/12    converted w/ Flecainide in the ED  . Lyme disease 01/2012  . Exercise-induced asthma   . Irritable bowel syndrome   . Hyperthyroidism    Past Surgical History  Procedure Laterality Date  . None      Current Outpatient Prescriptions  Medication Sig Dispense Refill  . diltiazem (CARDIZEM) 60 MG tablet Take 1 tablet (60 mg total) by mouth 4 (four) times daily.  30 tablet  1  . EPIPEN 2-PAK 0.3 MG/0.3ML DEVI       . flecainide (TAMBOCOR) 100 MG tablet Take 3 tablets by mouth as directed  12 tablet  1   No current facility-administered medications for this visit.    Physical Exam: Filed Vitals:   10/26/12 1541  BP: 111/90  Pulse: 91  Height: 5\' 10"  (1.778 m)  Weight: 204 lb 9.6 oz (92.806 kg)    GEN- The patient is well appearing, alert and oriented x 3 today.   Head- normocephalic, atraumatic Eyes-  Sclera clear, conjunctiva pink Ears- hearing intact Oropharynx- clear Lungs- Clear to ausculation bilaterally, normal work of breathing Heart- Regular rate and rhythm, no murmurs, rubs or gallops, PMI not laterally displaced GI- soft, NT, ND, + BS Extremities- no clubbing, cyanosis, or edema   Assessment and Plan:

## 2013-02-22 ENCOUNTER — Telehealth: Payer: Self-pay | Admitting: Physician Assistant

## 2013-02-22 NOTE — Telephone Encounter (Addendum)
Mr. Champlain called the answering service reporting he is back in atrial fibrillation. He awoke this morning with palpitations and noted his pulse was irregular. He denies other associated symptoms. He has a h/o PAF in the setting of hyperthyroidism. His outpatient levothyroxine was recently adjusted due to abnormal TFTs checked ~ 3 weeks ago (states "TSH was 40"). Suspect thyroid abnormalities/levothyroxine may be responsible for paroxysm. He has diltiazem and flecainide PRN previously prescribed for atrial fibrillation. He does not take diltiazem daily. Advised to take two doses of diltiazem (60mg ) this AM, if symptoms persist later this AM, advised to take pill-in-pocket flecainide (3 tabs/300mg  total) x 1. Note, echo 06/2012 indicated no structural cardiac changes. CHADS2 = 0. He will continue to monitor symptoms and call if they persist.     R. Hurman Horn, PA-C 02/22/2013 6:59 AM

## 2013-06-20 ENCOUNTER — Other Ambulatory Visit: Payer: Self-pay

## 2013-08-15 DIAGNOSIS — G4730 Sleep apnea, unspecified: Secondary | ICD-10-CM

## 2013-08-15 HISTORY — DX: Sleep apnea, unspecified: G47.30

## 2013-12-27 ENCOUNTER — Telehealth: Payer: Self-pay | Admitting: Internal Medicine

## 2013-12-27 MED ORDER — DILTIAZEM HCL 60 MG PO TABS: 60.0000 mg | ORAL_TABLET | Freq: Four times a day (QID) | ORAL | Status: DC

## 2013-12-27 MED ORDER — FLECAINIDE ACETATE 100 MG PO TABS: ORAL_TABLET | ORAL | Status: DC

## 2013-12-27 NOTE — Telephone Encounter (Signed)
Spoke with patient and he went into afib last night   His wife is in the beginning stages of labor and it's a bit stressful he says.  I have made him an appointment to come in and see Allred in June and will refill his medications

## 2013-12-27 NOTE — Telephone Encounter (Signed)
New message     Pt went into AFIB last night---but he has converted this am.  Need to let nurse know and want to talk to the nurse

## 2014-01-22 ENCOUNTER — Ambulatory Visit (INDEPENDENT_AMBULATORY_CARE_PROVIDER_SITE_OTHER): Payer: BC Managed Care – PPO | Admitting: Internal Medicine

## 2014-01-22 ENCOUNTER — Encounter: Payer: Self-pay | Admitting: Internal Medicine

## 2014-01-22 VITALS — BP 127/85 | HR 74 | Ht 70.0 in | Wt 200.0 lb

## 2014-01-22 DIAGNOSIS — R0609 Other forms of dyspnea: Secondary | ICD-10-CM

## 2014-01-22 DIAGNOSIS — G4733 Obstructive sleep apnea (adult) (pediatric): Secondary | ICD-10-CM | POA: Insufficient documentation

## 2014-01-22 DIAGNOSIS — R0989 Other specified symptoms and signs involving the circulatory and respiratory systems: Secondary | ICD-10-CM

## 2014-01-22 DIAGNOSIS — I4891 Unspecified atrial fibrillation: Secondary | ICD-10-CM

## 2014-01-22 DIAGNOSIS — R0683 Snoring: Secondary | ICD-10-CM

## 2014-01-22 NOTE — Progress Notes (Signed)
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   He is doing well.  He has had only 1 episode of afib since I saw him last.  This occurred around the time of stress related to his daughters birth.  It terminated with flecainide (pill in pocket) and has not returned.  He snores at night and does not feel well rested upon waking.  Today, he denies symptoms of palpitations, chest pain, shortness of breath,  lower extremity edema, dizziness, presyncope, or syncope.  The patient is otherwise without complaint today.   Past Medical History  Diagnosis Date  . Atrial fibrillation 07/01/12    converted w/ Flecainide in the ED  . Lyme disease 01/2012  . Exercise-induced asthma   . Irritable bowel syndrome   . Hyperthyroidism    Past Surgical History  Procedure Laterality Date  . None      Current Outpatient Prescriptions  Medication Sig Dispense Refill  . diltiazem (CARDIZEM) 60 MG tablet Take 1 tablet (60 mg total) by mouth 4 (four) times daily.  30 tablet  1  . EPIPEN 2-PAK 0.3 MG/0.3ML DEVI       . flecainide (TAMBOCOR) 100 MG tablet Take 3 tablets by mouth as directed  12 tablet  1  . levothyroxine (SYNTHROID, LEVOTHROID) 137 MCG tablet Take 137 mcg by mouth as directed.      Marland Kitchen levothyroxine (SYNTHROID, LEVOTHROID) 150 MCG tablet Take 1 tablet by mouth as directed.       No current facility-administered medications for this visit.    Physical Exam: Filed Vitals:   01/22/14 1035  BP: 127/85  Pulse: 74  Height: 5\' 10"  (1.778 m)  Weight: 200 lb (90.719 kg)   ROS- all systems reviewed and negative except as per HPI  GEN- The patient is well appearing, alert and oriented x 3 today.   Head- normocephalic, atraumatic Eyes-  Sclera clear, conjunctiva pink Ears- hearing intact Oropharynx- clear Lungs- Clear to ausculation bilaterally, normal work of breathing Heart- Regular rate and rhythm, no murmurs, rubs  or gallops, PMI not laterally displaced GI- soft, NT, ND, + BS Extremities- no clubbing, cyanosis, or edema  ekg today reveals sinus rhythm, normal ekg  Assessment and Plan:  1. afib Well controlled Uses flecainide pill in pocket chads2vasc is 0.   No changes today  2. Snoring Will order a sleep study  Return in 1 year

## 2014-01-22 NOTE — Patient Instructions (Signed)
Your physician wants you to follow-up in: 12 months with Dr Jacquiline Doe will receive a reminder letter in the mail two months in advance. If you don't receive a letter, please call our office to schedule the follow-up appointment.    Your physician has recommended that you have a sleep study. This test records several body functions during sleep, including: brain activity, eye movement, oxygen and carbon dioxide blood levels, heart rate and rhythm, breathing rate and rhythm, the flow of air through your mouth and nose, snoring, body muscle movements, and chest and belly movement.---[facility] to do

## 2014-03-04 ENCOUNTER — Ambulatory Visit (HOSPITAL_BASED_OUTPATIENT_CLINIC_OR_DEPARTMENT_OTHER): Payer: BC Managed Care – PPO | Attending: Internal Medicine | Admitting: Sleep Medicine

## 2014-03-04 VITALS — Ht 70.0 in | Wt 200.0 lb

## 2014-03-04 DIAGNOSIS — I4891 Unspecified atrial fibrillation: Secondary | ICD-10-CM | POA: Insufficient documentation

## 2014-03-04 DIAGNOSIS — G4733 Obstructive sleep apnea (adult) (pediatric): Secondary | ICD-10-CM | POA: Insufficient documentation

## 2014-03-19 DIAGNOSIS — G4730 Sleep apnea, unspecified: Secondary | ICD-10-CM

## 2014-03-19 DIAGNOSIS — G4710 Hypersomnia, unspecified: Secondary | ICD-10-CM

## 2014-03-19 NOTE — Sleep Study (Signed)
   NAME: Noah Bell DATE OF BIRTH:  07-10-1976 MEDICAL RECORD NUMBER [id number]  LOCATION: [facility]  PHYSICIAN: Barbaraann ShareCLANCE,Aleece Loyd M  DATE OF STUDY: 03/04/2014  SLEEP STUDY TYPE: Nocturnal Polysomnogram               REFERRING PHYSICIAN: Hillis RangeAllred, James, MD  INDICATION FOR STUDY: Hypersomnia with sleep apnea  EPWORTH SLEEPINESS SCORE:  9 HEIGHT: 5\' 10"  (177.8 cm)  WEIGHT: 200 lb (90.719 kg)    Body mass index is 28.7 kg/(m^2).  NECK SIZE: 17.5 in.  MEDICATIONS: Reviewed in the sleep record  SLEEP ARCHITECTURE: The patient had a total sleep time of 367 minutes with adequate slow-wave sleep for age and decreased quantity of REM. Sleep onset latency was normal at 12 minutes, and REM onset was increased at 151 minutes. Sleep efficiency was normal at 91%.  RESPIRATORY DATA: The patient was found to have 23 apneas and 79 obstructive hypoxemia is, giving him an AHI of 17 events per hour. The events were increased in the supine position, and also during REM. There was loud snoring noted throughout. The patient did not meet split-night protocol secondary to the majority of his events occurring after 3 AM.  OXYGEN DATA: There was oxygen desaturation as low as 76% with the patient's obstructive events  CARDIAC DATA: No clinically significant arrhythmias were noted  MOVEMENT/PARASOMNIA: Small numbers of periodic limb movements were seen, but no significant sleep disruption. There were no abnormal behaviors noted.   IMPRESSION/ RECOMMENDATION:    1) mild to moderate obstructive sleep apnea/hypopnea syndrome, with an AHI of 17 events per hour and oxygen desaturation as low as 76%. Treatment for this degree of sleep apnea can include a trial of modest weight loss, upper airway surgery, dental appliance, and also CPAP. Clinical correlation is suggested.     Barbaraann ShareLANCE,Renate Danh M Diplomate, American Board of Sleep Medicine  ELECTRONICALLY SIGNED ON:  03/19/2014, 1:28 PM [facility] PH: [phone]   FX: [phone] ACCREDITED BY THE AMERICAN ACADEMY OF SLEEP MEDICINE

## 2014-03-28 ENCOUNTER — Telehealth: Payer: Self-pay | Admitting: Internal Medicine

## 2014-03-28 NOTE — Telephone Encounter (Signed)
Follow Up   Pt has some questions regarding most recent sleep study and follow up. Please call.

## 2014-04-08 NOTE — Telephone Encounter (Signed)
Noah Bell, this patient seen by Dr. Johney Frame and sleep study ordered by Dr. Johney Frame. Please direct questions to him. GT

## 2014-04-10 ENCOUNTER — Telehealth: Payer: Self-pay | Admitting: *Deleted

## 2014-04-10 DIAGNOSIS — G4730 Sleep apnea, unspecified: Secondary | ICD-10-CM

## 2014-04-10 NOTE — Telephone Encounter (Signed)
Called patient to let him know he has been referred to Pulmonary for sleep apnea

## 2014-04-10 NOTE — Telephone Encounter (Signed)
Please refer to pulm for management ----- Message ----- From: Gerome Apley, RN Sent: 04/03/2014 2:46 PM To: Hillis Range, MD, Lendon Ka, RN Dr. Johney Frame to review Sleep Study. Please forward recommendations to triage nurse.   Left patient a message in regards to being referred to pulmonary

## 2014-05-28 ENCOUNTER — Other Ambulatory Visit: Payer: Self-pay | Admitting: *Deleted

## 2014-05-28 ENCOUNTER — Telehealth: Payer: Self-pay | Admitting: Internal Medicine

## 2014-05-28 DIAGNOSIS — G4730 Sleep apnea, unspecified: Secondary | ICD-10-CM

## 2014-05-28 NOTE — Telephone Encounter (Signed)
This referral has been made and unfortunately it  Has been sitting in referral que.  Mariane MastersDeborah Miller will call him today

## 2014-05-28 NOTE — Telephone Encounter (Signed)
New message           Pt is following up on his referral for pulmonary / pt says no one has called him to set up appt /

## 2014-07-08 ENCOUNTER — Ambulatory Visit (INDEPENDENT_AMBULATORY_CARE_PROVIDER_SITE_OTHER): Payer: BC Managed Care – PPO | Admitting: Pulmonary Disease

## 2014-07-08 ENCOUNTER — Encounter: Payer: Self-pay | Admitting: Pulmonary Disease

## 2014-07-08 VITALS — BP 122/76 | HR 85 | Temp 98.5°F | Ht 70.0 in | Wt 210.4 lb

## 2014-07-08 DIAGNOSIS — G4733 Obstructive sleep apnea (adult) (pediatric): Secondary | ICD-10-CM

## 2014-07-08 NOTE — Assessment & Plan Note (Signed)
The patient has mild to moderate obstructive sleep apnea by his recent study, but is not overly symptomatic and his atrial arrhythmia has been under good control. He is more concerned about his snoring disrupting his wife's sleep. I have reviewed the various treatment options for his degree of sleep apnea, including a trial of weight loss alone, as well as a dental appliance or CPAP. He would like to work on weight loss, but is also considering a dental appliance in order to improve his snoring and his wife's sleep. This would also help his actual sleep disordered breathing. I have offered to refer the patient to an orthodontist for consideration of a dental appliance, but he would like to speak with his dentist first.

## 2014-07-08 NOTE — Progress Notes (Signed)
Subjective:    Patient ID: Noah Bell, male    DOB: Dec 26, 1975, 38 y.o.   MRN: [id number]  HPI The patient is a 38 year old male who I've been asked to see for management of obstructive sleep apnea. He had a sleep study this year that showed mild to moderate disease, with an AHI of 17 events per hour. He has been noted to have loud snoring by his wife, but she has not commented on an abnormal breathing pattern during sleep. The patient feels that he sleeps very well through the night, but does not feel completely rested in the mornings upon arising. He denies any issues with sleepiness during the day, even with periods of inactivity. He has no issues watching movies in the evenings, but does get sleepy if he tries to watch mind less television. He denies any issues with sleepiness while driving. He feels that his weight is been neutral over the last 2 years, and his Epworth score today is 10. Of note, the patient has a history of an atrial arrhythmia, and he feels that it has been under very good control of the last few months.   Sleep Questionnaire What time do you typically go to bed?( Between what hours) 10pm-12AM 10pm-12AM at 1354 on 07/08/14 by Tommie SamsMindy S Silva, CMA How long does it take you to fall asleep? FEW MINUTES FEW MINUTES at 1354 on 07/08/14 by Tommie SamsMindy S Silva, CMA How many times during the night do you wake up? 0 0 at 1354 on 07/08/14 by Tommie SamsMindy S Silva, CMA What time do you get out of bed to start your day? 0600 0600 at 1354 on 07/08/14 by Tommie SamsMindy S Silva, CMA Do you drive or operate heavy machinery in your occupation? No No at 1354 on 07/08/14 by Tommie SamsMindy S Silva, CMA How much has your weight changed (up or down) over the past two years? (In pounds) 10 lb (4.536 kg) 10 lb (4.536 kg) at 1354 on 07/08/14 by Tommie SamsMindy S Silva, CMA Have you ever had a sleep study before? Yes Yes at 1354 on 07/08/14 by Tommie SamsMindy S Silva, CMA If yes, location of study? [facility] at 1354 on 07/08/14 by Tommie SamsMindy S  Silva, CMA If yes, date of study? 02/2014 02/2014 at 1354 on 07/08/14 by Tommie SamsMindy S Silva, CMA Do you currently use CPAP? No No at 1354 on 07/08/14 by Tommie SamsMindy S Silva, CMA Do you wear oxygen at any time? No No at 1354 on 07/08/14 by Tommie SamsMindy S Silva, CMA   Review of Systems  Constitutional: Negative for fever and unexpected weight change.  HENT: Negative for congestion, dental problem, ear pain, nosebleeds, postnasal drip, rhinorrhea, sinus pressure, sneezing, sore throat and trouble swallowing.   Eyes: Negative for redness and itching.  Respiratory: Negative for cough, chest tightness, shortness of breath and wheezing.   Cardiovascular: Negative for palpitations and leg swelling.  Gastrointestinal: Negative for nausea and vomiting.  Genitourinary: Negative for dysuria.  Musculoskeletal: Negative for joint swelling.  Skin: Negative for rash.  Neurological: Negative for headaches.  Hematological: Does not bruise/bleed easily.  Psychiatric/Behavioral: Negative for dysphoric mood. The patient is not nervous/anxious.        Objective:   Physical Exam Constitutional:  Well developed, no acute distress  HENT:  Nares patent without discharge  Oropharynx without exudate, palate and uvula are moderately elongated  Eyes:  Perrla, eomi, no scleral icterus  Neck:  No JVD, no TMG  Cardiovascular:  Normal rate, regular rhythm, no rubs or gallops.  No murmurs        Intact distal pulses  Pulmonary :  Normal breath sounds, no stridor or respiratory distress   No rales, rhonchi, or wheezing  Abdominal:  Soft, nondistended, bowel sounds present.  No tenderness noted.   Musculoskeletal:  No lower extremity edema noted.  Lymph Nodes:  No cervical lymphadenopathy noted  Skin:  No cyanosis noted  Neurologic:  Alert, appropriate, moves all 4 extremities without obvious deficit.         Assessment & Plan:

## 2014-07-08 NOTE — Patient Instructions (Signed)
Think about a dental appliance to treat your snoring and mild to moderate sleep apnea while you are working on weight loss Let me know if things are not going well.

## 2014-08-17 ENCOUNTER — Ambulatory Visit (INDEPENDENT_AMBULATORY_CARE_PROVIDER_SITE_OTHER): Payer: BLUE CROSS/BLUE SHIELD | Admitting: Physician Assistant

## 2014-08-17 VITALS — BP 123/83 | HR 73 | Temp 98.4°F | Resp 18 | Ht 70.0 in | Wt 201.4 lb

## 2014-08-17 DIAGNOSIS — B9789 Other viral agents as the cause of diseases classified elsewhere: Principal | ICD-10-CM

## 2014-08-17 DIAGNOSIS — J069 Acute upper respiratory infection, unspecified: Secondary | ICD-10-CM

## 2014-08-17 NOTE — Progress Notes (Signed)
Subjective:    Patient ID: Noah Bell, male    DOB: 04/14/1976, 39 y.o.   MRN: [id number]  HPI Pt presents to clinic with illness that seems to be getting better but he wants to make sure he is ok.  He started with cold symptoms and they have improved but he is left with this intermittent cough that this am started to become productive.  He overall feels better today than he has in the last week.   OTC meds - Advil cold and sinus - at the worse Wife and daughter sick  Review of Systems  Constitutional: Positive for chills. Negative for fever.  HENT: Positive for congestion, postnasal drip and rhinorrhea. Negative for sore throat.   Respiratory: Positive for cough (white sputum). Negative for shortness of breath and wheezing.        H/o exercised induce asthma, non-smoker  Gastrointestinal: Negative.   Musculoskeletal: Myalgias: resolved.  Neurological: Negative for headaches.   Patient Active Problem List   Diagnosis Date Noted  . OSA (obstructive sleep apnea) 01/22/2014  . History of Lyme disease 08/03/2012  . Family history of thyroid disease 08/03/2012  . Atrial fibrillation 07/08/2012   Prior to Admission medications   Medication Sig Start Date End Date Taking? Authorizing Provider  diltiazem (CARDIZEM) 60 MG tablet Take 1 tablet (60 mg total) by mouth 4 (four) times daily. Patient taking differently: Take 60 mg by mouth. As directed 12/27/13  Yes Hillis Range, MD  EPIPEN 2-PAK 0.3 MG/0.3ML DEVI  09/28/12  Yes Historical Provider, MD  flecainide (TAMBOCOR) 100 MG tablet Take 3 tablets by mouth as directed 12/27/13  Yes Hillis Range, MD  levothyroxine (SYNTHROID, LEVOTHROID) 150 MCG tablet Take 1 tablet by mouth as directed. 12/10/13  Yes Historical Provider, MD   Allergies  Allergen Reactions  . Other Anaphylaxis and Other (See Comments)    TREE NUTS    Medications, allergies, past medical history, surgical history, family history, social history and problem list reviewed  and updated.      Objective:   Physical Exam  Constitutional: He is oriented to person, place, and time. He appears well-developed and well-nourished.  BP 123/83 mmHg  Pulse 73  Temp(Src) 98.4 F (36.9 C) (Oral)  Resp 18  Ht  (1.778 m)  Wt 201 lb 6.4 oz (91.354 kg)  BMI 28.90 kg/m2  SpO2 99%   HENT:  Head: Normocephalic and atraumatic.  Right Ear: Hearing, tympanic membrane, external ear and ear canal normal.  Left Ear: Hearing, tympanic membrane, external ear and ear canal normal.  Nose: Nose normal.  Mouth/Throat: Uvula is midline, oropharynx is clear and moist and mucous membranes are normal.  Eyes: Conjunctivae are normal.  Neck: Normal range of motion.  Cardiovascular: Normal rate, regular rhythm and normal heart sounds.   No murmur heard. Pulmonary/Chest: Effort normal and breath sounds normal. He has no wheezes.  Neurological: He is alert and oriented to person, place, and time.  Skin: Skin is warm and dry.  Psychiatric: He has a normal mood and affect. His behavior is normal. Judgment and thought content normal.        Assessment & Plan:  Viral URI with cough  Symptomatic care - we will work on getting his mucus thinner but if the cough continues he will call for an abx due to the time frame but at this time I think that it is still viral and he is improving.  He agreed with the plan.  Benny Lennert  PA-C  [facility] 08/17/2014 12:11 PM

## 2014-08-17 NOTE — Patient Instructions (Signed)
Mucinex max -

## 2014-08-26 ENCOUNTER — Telehealth: Payer: Self-pay

## 2014-08-26 NOTE — Telephone Encounter (Signed)
Noah LennertSarah Weber   Patient now has a sore throat.  He has a new baby in the home and is requesting an antibiotic (per discussion during OV).   [facility] on [address] [place]  [phone]

## 2014-08-28 ENCOUNTER — Encounter: Payer: Self-pay | Admitting: Family Medicine

## 2014-08-28 ENCOUNTER — Ambulatory Visit (INDEPENDENT_AMBULATORY_CARE_PROVIDER_SITE_OTHER): Payer: BLUE CROSS/BLUE SHIELD | Admitting: Family Medicine

## 2014-08-28 VITALS — BP 130/88 | HR 79 | Temp 98.2°F | Resp 16 | Ht 70.0 in | Wt 199.0 lb

## 2014-08-28 DIAGNOSIS — R1310 Dysphagia, unspecified: Secondary | ICD-10-CM

## 2014-08-28 DIAGNOSIS — R0981 Nasal congestion: Secondary | ICD-10-CM

## 2014-08-28 DIAGNOSIS — J209 Acute bronchitis, unspecified: Secondary | ICD-10-CM

## 2014-08-28 DIAGNOSIS — E039 Hypothyroidism, unspecified: Secondary | ICD-10-CM | POA: Insufficient documentation

## 2014-08-28 NOTE — Patient Instructions (Signed)
Acute Bronchitis Bronchitis is inflammation of the airways that extend from the windpipe into the lungs (bronchi). The inflammation often causes mucus to develop. This leads to a cough, which is the most common symptom of bronchitis.  In acute bronchitis, the condition usually develops suddenly and goes away over time, usually in a couple weeks. Smoking, allergies, and asthma can make bronchitis worse. Repeated episodes of bronchitis may cause further lung problems.  CAUSES Acute bronchitis is most often caused by the same virus that causes a cold. The virus can spread from person to person (contagious) through coughing, sneezing, and touching contaminated objects. SIGNS AND SYMPTOMS   Cough.   Fever.   Coughing up mucus.   Body aches.   Chest congestion.   Chills.   Shortness of breath.   Sore throat.  DIAGNOSIS  Acute bronchitis is usually diagnosed through a physical exam. Your health care provider will also ask you questions about your medical history. Tests, such as chest X-rays, are sometimes done to rule out other conditions.  TREATMENT  Acute bronchitis usually goes away in a couple weeks. Oftentimes, no medical treatment is necessary. Medicines are sometimes given for relief of fever or cough. Antibiotic medicines are usually not needed but may be prescribed in certain situations. In some cases, an inhaler may be recommended to help reduce shortness of breath and control the cough. A cool mist vaporizer may also be used to help thin bronchial secretions and make it easier to clear the chest.  HOME CARE INSTRUCTIONS  Get plenty of rest.   Drink enough fluids to keep your urine clear or pale yellow (unless you have a medical condition that requires fluid restriction). Increasing fluids may help thin your respiratory secretions (sputum) and reduce chest congestion, and it will prevent dehydration.   Take medicines only as directed by your health care provider.  If  you were prescribed an antibiotic medicine, finish it all even if you start to feel better.  Avoid smoking and secondhand smoke. Exposure to cigarette smoke or irritating chemicals will make bronchitis worse. If you are a smoker, consider using nicotine gum or skin patches to help control withdrawal symptoms. Quitting smoking will help your lungs heal faster.   Reduce the chances of another bout of acute bronchitis by washing your hands frequently, avoiding people with cold symptoms, and trying not to touch your hands to your mouth, nose, or eyes.   Keep all follow-up visits as directed by your health care provider.  SEEK MEDICAL CARE IF: Your symptoms do not improve after 1 week of treatment.  SEEK IMMEDIATE MEDICAL CARE IF:  You develop an increased fever or chills.   You have chest pain.   You have severe shortness of breath.  You have bloody sputum.   You develop dehydration.  You faint or repeatedly feel like you are going to pass out.  You develop repeated vomiting.  You develop a severe headache. MAKE SURE YOU:   Understand these instructions.  Will watch your condition.  Will get help right away if you are not doing well or get worse. Document Released: 09/08/2004 Document Revised: 12/16/2013 Document Reviewed: 01/22/2013 [facility] Patient Information 2015 [place], [place]. This information is not intended to replace advice given to you by your health care provider. Make sure you discuss any questions you have with your health care provider.    Upper Respiratory Infection, Adult An upper respiratory infection (URI) is also known as the common cold. It is often caused by  a type of germ (virus). Colds are easily spread (contagious). You can pass it to others by kissing, coughing, sneezing, or drinking out of the same glass. Usually, you get better in 1 or 2 weeks.  HOME CARE   Only take medicine as told by your doctor.  Use a warm mist humidifier or breathe in  steam from a hot shower.  Drink enough water and fluids to keep your pee (urine) clear or pale yellow.  Get plenty of rest.  Return to work when your temperature is back to normal or as told by your doctor. You may use a face mask and wash your hands to stop your cold from spreading. GET HELP RIGHT AWAY IF:   After the first few days, you feel you are getting worse.  You have questions about your medicine.  You have chills, shortness of breath, or brown or red spit (mucus).  You have yellow or brown snot (nasal discharge) or pain in the face, especially when you bend forward.  You have a fever, puffy (swollen) neck, pain when you swallow, or white spots in the back of your throat.  You have a bad headache, ear pain, sinus pain, or chest pain.  You have a high-pitched whistling sound when you breathe in and out (wheezing).  You have a lasting cough or cough up blood.  You have sore muscles or a stiff neck. MAKE SURE YOU:   Understand these instructions.  Will watch your condition.  Will get help right away if you are not doing well or get worse. Document Released: 01/18/2008 Document Revised: 10/24/2011 Document Reviewed: 11/06/2013 [facility] Patient Information 2015 [place]. This information is not intended to replace advice given to you by your health care provider. Make sure you discuss any questions you have with your health care provider.

## 2014-08-28 NOTE — Progress Notes (Signed)
Subjective:    Patient ID: Noah Bell, male    DOB: [birth year], 39 y.o.   MRN: [id number]  HPI  This 39 y.o. Male is here for follow-up regarding URI symptoms, evaluated on 08/17/2014 at 1023 [facility]. He was advised that he had a viral infection and given anticipatory guidance. He has seen improvement but last week had a severe sore throat and sinus HA; wife and young child are ill also. Wife has sinus infection and is taking an antibiotic. Pt has sinus congestion and slightly prod cough (white to clear sputum). He denies fever/chills, pleuritic CP or tightness, wheezing, rash, neck stiffness, n/v/d, myalgias, dizziness, weakness or syncope. OTC Tylenol Sinus, Mucinex and ibuprofen have been effective.  Pt c/o swallowing difficulty with solids only. Onset ~ 2 months ago. He denies heartburn but does recall one episode of reflux associated w/ nocturnal cough. Pt reports swallowing problems, often after the first bite of food; he has had regurgitation of water on one occasion. Pt does endorse a feeling of fullness in throat and difficulty swallowing when he is anxious or stressed.  Pt has hypothyroidism, resulting from post- I131 irradiation in 2014.  Dr. Dorisann Frames is endocrine specialist; pt to call office to schedule follow-up. He states he has never had any imaging of the thyroid gland. (He did have NM  Study in Feb 2014 prior to treatment/ablation).  Pt has recent sleep study ordered by Dr. Johney Frame; he has mild OSA and will discuss dental applliance with dentist.   Patient Active Problem List   Diagnosis Date Noted  . Hypothyroidism 08/28/2014  . OSA (obstructive sleep apnea) 01/22/2014  . History of Lyme disease 08/03/2012  . Family history of thyroid disease 08/03/2012  . Atrial fibrillation 07/08/2012    Prior to Admission medications   Medication Sig Start Date End Date Taking? Authorizing Provider  diltiazem (CARDIZEM) 60 MG tablet Take 1 tablet (60 mg total) by mouth 4 (four)  times daily. Patient taking differently: Take 60 mg by mouth. As directed 12/27/13  Yes Hillis Range, MD  EPIPEN 2-PAK 0.3 MG/0.3ML DEVI  09/28/12  Yes Historical Provider, MD  flecainide (TAMBOCOR) 100 MG tablet Take 3 tablets by mouth as directed 12/27/13  Yes Hillis Range, MD  levothyroxine (SYNTHROID, LEVOTHROID) 150 MCG tablet Take 1 tablet by mouth as directed. 12/10/13  Yes Historical Provider, MD   History   Social History  . Marital Status: Married    Spouse Name: N/A    Number of Children: N/A  . Years of Education: N/A   Occupational History  . optician    Social History Main Topics  . Smoking status: Former Smoker -- 8 years    Types: Cigarettes    Quit date: 08/15/2005  . Smokeless tobacco: Never Used     Comment: 5-10 cigs daily when smoked 07/08/14. also smoked off and on for 8 years  . Alcohol Use: 0.0 oz/week    0 Not specified per week     Comment: occasionlly (1-2 drinks per week)  . Drug Use: No  . Sexual Activity:    Partners: Female     Comment: Married in November 2013   Other Topics Concern  . Not on file   Social History Narrative   Lives in [place].  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 Thyroid disease Mother   . Asthma Mother   . Diabetes Maternal Grandmother   . Diabetes Maternal  Grandfather   . Diabetes Father   . Sleep apnea Mother     Review of Systems  Constitutional: Negative.   HENT: Positive for congestion, rhinorrhea, sore throat and trouble swallowing. Negative for dental problem, ear pain, facial swelling, mouth sores, nosebleeds, sinus pressure and voice change.   Eyes: Negative.   Respiratory: Positive for apnea and cough. Negative for chest tightness, shortness of breath and wheezing.   Cardiovascular: Negative.   Gastrointestinal: Negative.   Musculoskeletal: Negative.   Skin: Negative.   As per HPI.     Objective:   Physical Exam  Constitutional: He is oriented to  person, place, and time. Vital signs are normal. He appears well-developed and well-nourished. He does not appear ill. No distress.  HENT:  Head: Normocephalic and atraumatic.  Right Ear: Hearing, tympanic membrane, external ear and ear canal normal.  Left Ear: Hearing, tympanic membrane, external ear and ear canal normal.  Nose: Nose normal. No rhinorrhea, nasal deformity or septal deviation. Right sinus exhibits no maxillary sinus tenderness and no frontal sinus tenderness. Left sinus exhibits no maxillary sinus tenderness and no frontal sinus tenderness.  Mouth/Throat: Uvula is midline and mucous membranes are normal. No oral lesions. Normal dentition. No uvula swelling. Posterior oropharyngeal erythema present. No oropharyngeal exudate or posterior oropharyngeal edema.  Eyes: Conjunctivae and EOM are normal. Pupils are equal, round, and reactive to light. No scleral icterus.  Neck: Trachea normal, normal range of motion, full passive range of motion without pain and phonation normal. Neck supple. JVD:   No spinous process tenderness and no muscular tenderness present. No thyroid mass and no thyromegaly present.  Thyroid- normal to palpation but very slight fullness noted frontal examination. Neck circumference increased.  Cardiovascular: Normal rate, regular rhythm and normal heart sounds.  Exam reveals no gallop and no friction rub.   No murmur heard. Pulmonary/Chest: Effort normal and breath sounds normal. No respiratory distress. He has no wheezes. He has no rales.  Abdominal: Soft. Bowel sounds are normal. He exhibits no distension and no mass. There is no tenderness. There is no guarding.  Musculoskeletal: Normal range of motion.  Neurological: He is alert and oriented to person, place, and time. No cranial nerve deficit. Coordination normal.  Skin: Skin is warm and dry. No rash noted. He is not diaphoretic. No erythema. No pallor.  Psychiatric: He has a normal mood and affect. His behavior  is normal. Judgment and thought content normal.  Nursing note and vitals reviewed.      Assessment & Plan:  Sinus congestion- Continue symptomatic treatment, OTC medications and humidifier use.  Acute bronchitis, unspecified organism- Discussed viral nature of infection with pt and no need for antibiotics.  Difficulty swallowing solids - Pt to discuss this w/ Dr. Talmage NapBalan also; imaging of thyroid gland may be necessary. Plan: DG Esophagus  Hypothyroidism, unspecified hypothyroidism type- Schedule f/u with Dr. Talmage NapBalan.  Sch CPE /fasting labs- 3 months.

## 2014-09-04 NOTE — Telephone Encounter (Signed)
Pt was seen on 1/14

## 2014-11-27 ENCOUNTER — Encounter: Payer: BLUE CROSS/BLUE SHIELD | Admitting: Family Medicine

## 2015-08-18 ENCOUNTER — Other Ambulatory Visit: Payer: Self-pay | Admitting: Internal Medicine

## 2015-08-18 NOTE — Telephone Encounter (Signed)
Please fill.  Flecainide is pill in the pocket approach.  Let him know he needs a follow up please

## 2015-08-18 NOTE — Telephone Encounter (Signed)
°*  STAT* If patient is at the pharmacy, call can be transferred to refill team.   1. Which medications need to be refilled? (please list name of each medication and dose if known)  Cardizem pt don't know mg Flecainide-pt don't know mg   2. Which pharmacy/location (including street and city if local pharmacy) is medication to be sent to?[facility] [place]/  3. Do they need a 30 day or 90 day supply? 30 day

## 2015-08-18 NOTE — Telephone Encounter (Signed)
OVER DUE FOR F/U, HASNT BEEN SEEN SINCE 01/2014, WANTS FLEC AND CARDIZEM. THE FLEC WAS ONLY 12 PILLS TO BEGIN WITH, WAS IT MEANT TO BE A PERMANENT MEDICATION? PLEASE ADVISE THANKS

## 2015-08-19 MED ORDER — DILTIAZEM HCL 60 MG PO TABS: 60.0000 mg | ORAL_TABLET | Freq: Four times a day (QID) | ORAL | Status: DC

## 2015-08-19 MED ORDER — FLECAINIDE ACETATE 100 MG PO TABS: ORAL_TABLET | ORAL | Status: DC

## 2015-08-21 ENCOUNTER — Other Ambulatory Visit: Payer: Self-pay | Admitting: Internal Medicine

## 2015-08-21 MED ORDER — FLECAINIDE ACETATE 100 MG PO TABS: ORAL_TABLET | ORAL | Status: DC

## 2015-08-21 NOTE — Telephone Encounter (Signed)
Medication was sent to the wrong pharmacy and pt has made an appointment. Rx filled up until pt's appointment time.

## 2015-09-28 ENCOUNTER — Encounter: Payer: Self-pay | Admitting: Internal Medicine

## 2015-09-28 ENCOUNTER — Ambulatory Visit (INDEPENDENT_AMBULATORY_CARE_PROVIDER_SITE_OTHER): Payer: BLUE CROSS/BLUE SHIELD | Admitting: Internal Medicine

## 2015-09-28 VITALS — BP 124/66 | HR 82 | Ht 70.0 in | Wt 205.4 lb

## 2015-09-28 DIAGNOSIS — I480 Paroxysmal atrial fibrillation: Secondary | ICD-10-CM

## 2015-09-28 DIAGNOSIS — G4733 Obstructive sleep apnea (adult) (pediatric): Secondary | ICD-10-CM | POA: Diagnosis not present

## 2015-09-28 NOTE — Progress Notes (Signed)
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   He is doing well.  He has had only 2 episodes of afib since I saw him last.  This occurred around the time of stress related to his second daughters birth.  It terminated with flecainide (pill in pocket) and has not returned. Today, he denies symptoms of palpitations, chest pain, shortness of breath,  lower extremity edema, dizziness, presyncope, or syncope.  The patient is otherwise without complaint today.   Past Medical History  Diagnosis Date  . Atrial fibrillation (HCC) 07/01/12    converted w/ Flecainide in the ED  . Lyme disease 01/2012  . Exercise-induced asthma   . Irritable bowel syndrome   . Hyperthyroidism    Past Surgical History  Procedure Laterality Date  . None      Current Outpatient Prescriptions  Medication Sig Dispense Refill  . diltiazem (CARDIZEM) 60 MG tablet Take 60 mg by mouth as directed. At onset of AFIB    . EPIPEN 2-PAK 0.3 MG/0.3ML DEVI Inject 0.3 mg into the muscle once. For anaphylaxis    . flecainide (TAMBOCOR) 100 MG tablet Take 3 tablets by mouth as directed 12 tablet 1  . levothyroxine (SYNTHROID, LEVOTHROID) 175 MCG tablet Take 1 tablet by mouth as directed.  2   No current facility-administered medications for this visit.    Physical Exam: Filed Vitals:   09/28/15 1627  BP: 124/66  Pulse: 82  Height:  (1.778 m)  Weight: 205 lb 6.4 oz (93.169 kg)   ROS- all systems reviewed and negative except as per HPI  GEN- The patient is well appearing, alert and oriented x 3 today.   Head- normocephalic, atraumatic Eyes-  Sclera clear, conjunctiva pink Ears- hearing intact Oropharynx- clear Lungs- Clear to ausculation bilaterally, normal work of breathing Heart- Regular rate and rhythm, no murmurs, rubs or gallops, PMI not laterally displaced GI- soft, NT, ND, + BS Extremities- no clubbing, cyanosis, or  edema  ekg today reveals sinus rhythm, normal ekg  Assessment and Plan:  1. afib Well controlled Uses flecainide pill in pocket chads2vasc is 0.   No changes today  2. Mild sleep apnea Stable No change required today   Return in 1 year  Hillis Range MD, [facility] 09/28/2015 5:13 PM

## 2015-09-28 NOTE — Patient Instructions (Signed)

## 2015-10-15 ENCOUNTER — Other Ambulatory Visit: Payer: Self-pay | Admitting: Internal Medicine

## 2015-11-25 ENCOUNTER — Other Ambulatory Visit: Payer: Self-pay | Admitting: Internal Medicine

## 2015-11-25 ENCOUNTER — Telehealth: Payer: Self-pay | Admitting: *Deleted

## 2015-11-25 NOTE — Telephone Encounter (Signed)
Corey Skainsric Glantz  10/15/2015  Refill  MRN:  [id number]   Description: 40 year old male  Provider: Hillis RangeJames Allred, MD  Department: [facility] [address] [facility]       Call Documentation     No notes of this type exist for this encounter.     Encounter MyChart Messages     No messages in this encounter     Approved      Disp Refills Start End    flecainide (TAMBOCOR) 100 MG tablet 12 tablet 6 10/16/2015     Sig:  TAKE 3 TABLETS BY MOUTH AS DIRECTED    Class:  Normal    DAW:  No    Authorizing Provider:  Hillis RangeJames Allred, MD    Ordering User:  Jama FlavorsKeasha L Manning, CMA    diltiazem (CARDIZEM) 60 MG tablet 30 tablet 6 10/16/2015     Sig:  Take 60 mg by mouth as directed.    Class:  Normal    DAW:  No    Authorizing Provider:  Hillis RangeJames Allred, MD    Ordering User:  Jama FlavorsKeasha L Manning, CMA      Visit Pharmacy     [facility] [phone] - [place], [place] - [phone]   called about refills on flec & dilt, we [facility], see notes. pt expressed understanding.

## 2016-08-17 ENCOUNTER — Encounter: Payer: Self-pay | Admitting: Internal Medicine

## 2016-10-03 ENCOUNTER — Ambulatory Visit: Payer: BLUE CROSS/BLUE SHIELD | Admitting: Internal Medicine

## 2017-01-21 ENCOUNTER — Emergency Department (HOSPITAL_COMMUNITY)
Admission: EM | Admit: 2017-01-21 | Discharge: 2017-01-21 | Disposition: A | Discharge status: Home / Self Care | Payer: BLUE CROSS/BLUE SHIELD | Attending: Emergency Medicine | Admitting: Emergency Medicine

## 2017-01-21 ENCOUNTER — Encounter (HOSPITAL_COMMUNITY): Payer: Self-pay | Admitting: Emergency Medicine

## 2017-01-21 ENCOUNTER — Emergency Department (HOSPITAL_COMMUNITY): Payer: BLUE CROSS/BLUE SHIELD

## 2017-01-21 DIAGNOSIS — E039 Hypothyroidism, unspecified: Secondary | ICD-10-CM | POA: Diagnosis not present

## 2017-01-21 DIAGNOSIS — R197 Diarrhea, unspecified: Secondary | ICD-10-CM | POA: Diagnosis not present

## 2017-01-21 DIAGNOSIS — K529 Noninfective gastroenteritis and colitis, unspecified: Secondary | ICD-10-CM | POA: Insufficient documentation

## 2017-01-21 DIAGNOSIS — R1010 Upper abdominal pain, unspecified: Secondary | ICD-10-CM | POA: Diagnosis present

## 2017-01-21 DIAGNOSIS — Z87891 Personal history of nicotine dependence: Secondary | ICD-10-CM | POA: Diagnosis not present

## 2017-01-21 LAB — COMPREHENSIVE METABOLIC PANEL
ALT: 24 U/L (ref 17–63)
AST: 20 U/L (ref 15–41)
Albumin: 4 g/dL (ref 3.5–5.0)
Alkaline Phosphatase: 82 U/L (ref 38–126)
Anion gap: 7 (ref 5–15)
BUN: 17 mg/dL (ref 6–20)
CO2: 27 mmol/L (ref 22–32)
Calcium: 9.1 mg/dL (ref 8.9–10.3)
Chloride: 106 mmol/L (ref 101–111)
Creatinine, Ser: 1.08 mg/dL (ref 0.61–1.24)
Glucose, Bld: 100 mg/dL — ABNORMAL HIGH (ref 65–99)
Potassium: 4 mmol/L (ref 3.5–5.1)
Sodium: 140 mmol/L (ref 135–145)
Total Bilirubin: 0.8 mg/dL (ref 0.3–1.2)
Total Protein: 7.6 g/dL (ref 6.5–8.1)

## 2017-01-21 LAB — URINALYSIS, ROUTINE W REFLEX MICROSCOPIC
BILIRUBIN URINE: NEGATIVE
Glucose, UA: NEGATIVE mg/dL
HGB URINE DIPSTICK: NEGATIVE
KETONES UR: NEGATIVE mg/dL
Leukocytes, UA: NEGATIVE
Nitrite: NEGATIVE
PROTEIN: NEGATIVE mg/dL
Specific Gravity, Urine: 1.03 (ref 1.005–1.030)
pH: 5.5 (ref 5.0–8.0)

## 2017-01-21 LAB — CBC
HCT: 45.7 % (ref 39.0–52.0)
Hemoglobin: 15.9 g/dL (ref 13.0–17.0)
MCH: 28.9 pg (ref 26.0–34.0)
MCHC: 34.8 g/dL (ref 30.0–36.0)
MCV: 82.9 fL (ref 78.0–100.0)
Platelets: 180 10*3/uL (ref 150–400)
RBC: 5.51 MIL/uL (ref 4.22–5.81)
RDW: 12.8 % (ref 11.5–15.5)
WBC: 9.4 10*3/uL (ref 4.0–10.5)

## 2017-01-21 LAB — LIPASE, BLOOD: Lipase: 21 U/L (ref 11–51)

## 2017-01-21 MED ORDER — LOPERAMIDE HCL 2 MG PO CAPS: 2.0000 mg | ORAL_CAPSULE | ORAL | 0 refills | Status: AC | PRN

## 2017-01-21 MED ORDER — LOPERAMIDE HCL 2 MG PO CAPS
4.0000 mg | ORAL_CAPSULE | Freq: Once | ORAL | Status: AC
  Administered 2017-01-21: 4 mg via ORAL
  Filled 2017-01-21: qty 2

## 2017-01-21 NOTE — ED Notes (Signed)
PA AT BEDSIDE  

## 2017-01-21 NOTE — ED Triage Notes (Signed)
Pt sent from urgent care for c/o abdominal pain onset Wednesday with diarrhea.

## 2017-01-21 NOTE — ED Provider Notes (Signed)
[facility]EMERGENCY DEPT Provider Note   CSN: [id number] Arrival date & time: 01/21/17  1191     History   Chief Complaint Chief Complaint  Patient presents with  . Abdominal Pain    HPI Noah Bell is a 41 y.o. male who presents today with chief complaint acute onset, gradually worsening intermittent abdominal pain with associated diarrhea for 5 days. He states that on Tuesday evening he developed an upper abdominal pain that worsened after a meal of bratwurst followed by multiple episodes of diarrhea. Since then he has had multiple episodes of diarrhea daily following any oral intake. He is able to tolerate fluids. He denies nausea or vomiting. Stool is watery and nonbloody. Pain is intermittent sharp stabbing pain localized primarily to the upper abdomen. He states his right lower quadrant is tender to palpation. Pain is worsened with eating, bending, movement and palpation. He denies any known sick contacts, and has been eating toast and grilled chicken since his symptoms began. He has tried Pepto-Bismol without relief of his symptoms. He went to urgent care today who referred him here for further workup. He denies fever, chills, chest pain, shortness of breath, dysuria, hematuria, melena, or any other urinary symptoms. No recent antibiotic use or travel. The history is provided by the patient.    Past Medical History:  Diagnosis Date  . Atrial fibrillation (HCC) 07/01/12   converted w/ Flecainide in the ED  . Exercise-induced asthma   . Hyperthyroidism   . Irritable bowel syndrome   . Lyme disease 01/2012  . Sleep apnea 2015   not severe enough for CPAP,  dental appliance recommended    Patient Active Problem List   Diagnosis Date Noted  . Hypothyroidism 08/28/2014  . OSA (obstructive sleep apnea) 01/22/2014  . History of Lyme disease 08/03/2012  . Family history of thyroid disease 08/03/2012  . Atrial fibrillation (HCC) 07/08/2012    Past Surgical History:  Procedure  Laterality Date  . None         Home Medications    Prior to Admission medications   Medication Sig Start Date End Date Taking? Authorizing Provider  levothyroxine (SYNTHROID, LEVOTHROID) 175 MCG tablet Take 175 mcg by mouth as directed.  08/26/15  Yes [provider]  diltiazem (CARDIZEM) 60 MG tablet Take 60 mg by mouth as directed. Patient not taking: Reported on 01/21/2017 10/16/15   Allred, Fayrene Fearing, MD  EPIPEN 2-PAK 0.3 MG/0.3ML DEVI Inject 0.3 mg into the muscle once. For anaphylaxis 09/28/12   [provider]  flecainide (TAMBOCOR) 100 MG tablet Take 3 tablets by mouth as directed Patient not taking: Reported on 01/21/2017 08/21/15   Allred, Fayrene Fearing, MD  flecainide (TAMBOCOR) 100 MG tablet TAKE 3 TABLETS BY MOUTH AS DIRECTED Patient not taking: Reported on 01/21/2017 10/16/15   Hillis Range, MD  loperamide (IMODIUM) 2 MG capsule Take 1 capsule (2 mg total) by mouth as needed for diarrhea or loose stools (up to 16mg  per day). 01/21/17   Jeanie Sewer, PA-C    Family History Family History  Problem Relation Age of Onset  . Thyroid disease Mother   . Asthma Mother   . Sleep apnea Mother   . Diabetes Maternal Grandmother   . Diabetes Maternal Grandfather   . Diabetes Father     Social History Social History  Substance Use Topics  . Smoking status: Former Smoker    Years: 8.00    Types: Cigarettes    Quit date: 08/15/2005  . Smokeless tobacco: Never  Used     Comment: 5-10 cigs daily when smoked 07/08/14. also smoked off and on for 8 years  . Alcohol use 0.0 oz/week     Comment: occasionlly (1-2 drinks per week)     Allergies   Other   Review of Systems Review of Systems  Constitutional: Negative for chills and fever.  Respiratory: Negative for shortness of breath.   Gastrointestinal: Positive for abdominal pain and diarrhea. Negative for blood in stool, constipation and nausea.  Genitourinary: Negative for dysuria, flank pain and hematuria.  Musculoskeletal:  Negative for back pain.  All other systems reviewed and are negative.    Physical Exam Updated Vital Signs BP 128/81   Pulse 84   Temp 98 F (36.7 C) (Oral)   Resp 20   Ht 5\' 10"  (1.778 m)   Wt 93.9 kg (207 lb)   SpO2 99%   BMI 29.70 kg/m   Physical Exam  Constitutional: He appears well-developed and well-nourished.  HENT:  Head: Normocephalic and atraumatic.  Eyes: Conjunctivae are normal.  Neck: Neck supple. No JVD present. No tracheal deviation present.  Cardiovascular: Normal rate, regular rhythm and normal heart sounds.   No murmur heard. Pulmonary/Chest: Effort normal and breath sounds normal. No respiratory distress.  Abdominal: Soft. He exhibits no distension. There is no tenderness.  Bowel sounds hypoactive. Maximally tender to palpation in the right lower quadrant and at McBurney's point. Rovsing's is absent, Murphy's is absent. Mild generalized tenderness to palpation otherwise.  Genitourinary:  Genitourinary Comments: No CVA tenderness  Musculoskeletal: He exhibits no edema.  No midline spine TTP, no paraspinal muscle tenderness  Neurological: He is alert.  Skin: Skin is warm and dry.  Psychiatric: He has a normal mood and affect. His behavior is normal.  Nursing note and vitals reviewed.    ED Treatments / Results  Labs (all labs ordered are listed, but only abnormal results are displayed) Labs Reviewed  COMPREHENSIVE METABOLIC PANEL - Abnormal; Notable for the following:       Result Value   Glucose, Bld 100 (*)    All other components within normal limits  LIPASE, BLOOD  CBC  URINALYSIS, ROUTINE W REFLEX MICROSCOPIC    EKG  EKG Interpretation None       Radiology Dg Abdomen Acute W/chest  Result Date: 01/21/2017 CLINICAL DATA:  Abdominal pain and diarrhea EXAM: DG ABDOMEN ACUTE W/ 1V CHEST COMPARISON:  Chest radiograph July 04, 2012. FINDINGS: PA chest: Lungs are clear. Heart size and pulmonary vascularity are normal. No adenopathy.  Supine and upright abdomen: There is a paucity of bowel gas. There is no bowel dilatation or air-fluid level to suggest bowel obstruction. No free air. There are small phleboliths in pelvis. IMPRESSION: Paucity of bowel gas. While this finding may be seen normally, it may represent enteritis or early ileus. Bowel obstruction not felt to be likely. No free air. Lungs clear. Electronically Signed   By: Bretta Bang III M.D.   On: 01/21/2017 11:38    Procedures Procedures (including critical care time)  Medications Ordered in ED Medications  loperamide (IMODIUM) capsule 4 mg (not administered)     Initial Impression / Assessment and Plan / ED Course  I have reviewed the triage vital signs and the nursing notes.  Pertinent labs & imaging results that were available during my care of the patient were reviewed by me and considered in my medical decision making (see chart for details).     Patient with chief complaint multiple  episodes of nonbloody diarrhea with associated abdominal pain and cramping for 4 days. Afebrile, vital signs are stable. Labwork is unremarkable. Based on clinical course of symptoms and workup, low suspicion of appendicitis, cholecystitis, nephrolithiasis, or small bowel obstruction. Low suspicion of diverticulitis. X-rays consistent with enteritis. He is well hydrated and tolerating by mouth fluids. No distress while in the ED. He'll be discharged with Imodium for diarrhea and will be reevaluated by his primary care this week. Discussed indications for return to the ED. Pt verbalized understanding of and agreement with plan and is safe for discharge home at this time.  Final Clinical Impressions(s) / ED Diagnoses   Final diagnoses:  Enteritis    New Prescriptions New Prescriptions   LOPERAMIDE (IMODIUM) 2 MG CAPSULE    Take 1 capsule (2 mg total) by mouth as needed for diarrhea or loose stools (up to 16mg  per day).     Jeanie SewerFawze, Umair Rosiles A, PA-C 01/21/17 1242      Doug SouJacubowitz, Sam, MD 01/21/17 [phone]

## 2017-01-21 NOTE — ED Notes (Signed)
Got patient undress on the monitor waiting for provider 

## 2017-01-21 NOTE — Discharge Instructions (Signed)
Use Imodium as needed for diarrhea. He may apply a heating pad to the area for comfort. Follow-up with your primary care later this week for reevaluation. Return to the ED if any concerning signs or symptoms develop.

## 2017-01-21 NOTE — ED Notes (Signed)
Pt attempting to use the restroom to obtain a urine sample.

## 2017-01-21 NOTE — ED Provider Notes (Signed)
Complains of watery diarrhea for the past 4 days. Nonbloody nonbilious. No fever. He treated himself with doses of Pepto-Bismol. 5 episodes of diarrhea this morning. Also complains of mild diffuse abdominal pain which waxes and wanes. Was seen at urgent care center earlier today sent here for further evaluation, and possible CT scan. He denies any nausea or vomiting. No recent travel or antibiotic use. On exam alert and in no distress lungs clear auscultation heart regular rate and rhythm abdomen nondistended normal active bowel sounds mild diffuse tenderness. No guarding rigidity or rebound.   Noah SouJacubowitz, Noah Felix, MD 01/21/17 [id number]

## 2018-01-27 IMAGING — DX DG ABDOMEN ACUTE W/ 1V CHEST
3 series · 3 of 3 positions shown · non-contrast
Comparison: Chest radiograph July 04, 2012.

CLINICAL DATA: Abdominal pain and diarrhea

EXAM:
DG ABDOMEN ACUTE W/ 1V CHEST

[chest pa]
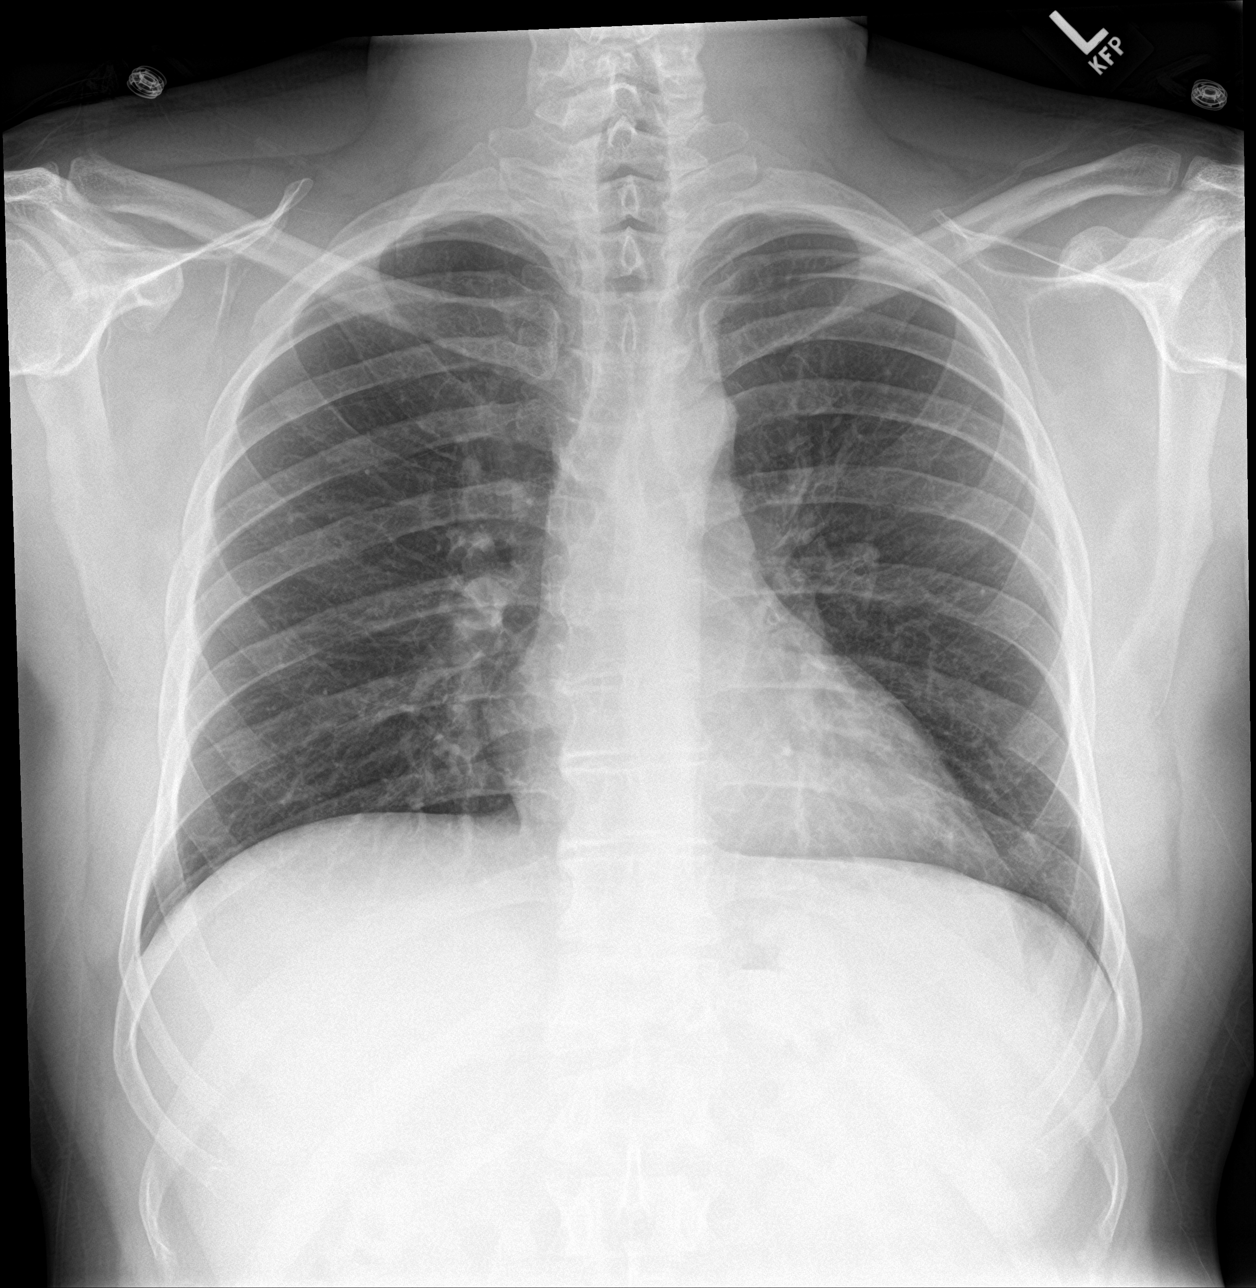

[abdomen erect]
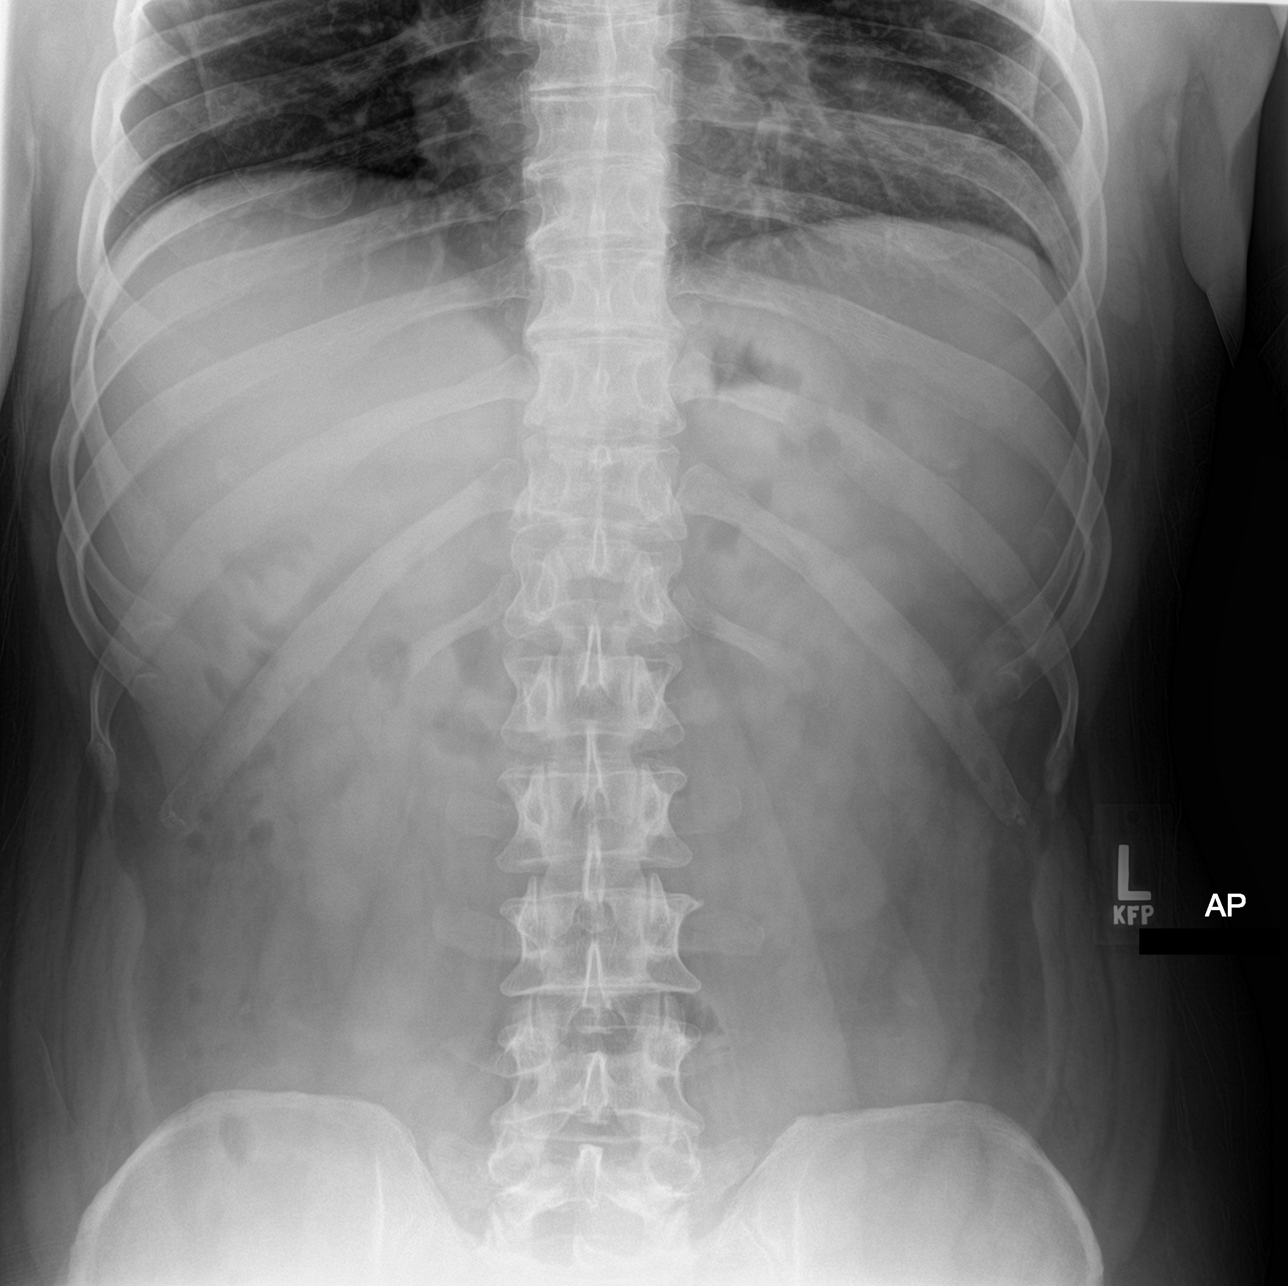

[abdomen supine]
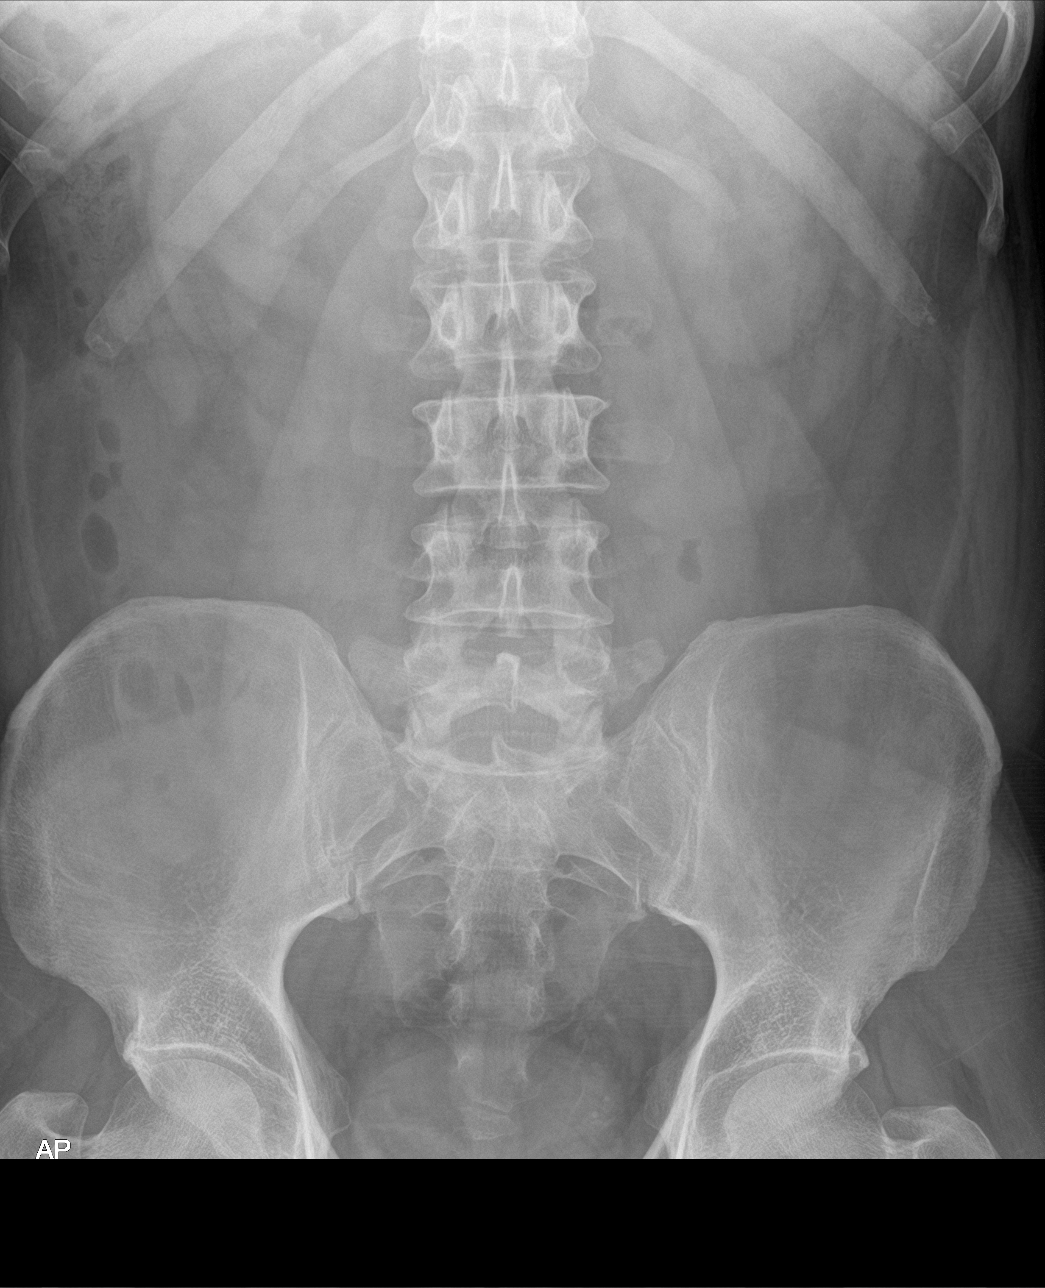

[3 of 3 positions shown; findings below may reference images not displayed]

FINDINGS: PA chest: Lungs are clear. Heart size and pulmonary vascularity are
normal. No adenopathy.

Supine and upright abdomen: There is a paucity of bowel gas. There
is no bowel dilatation or air-fluid level to suggest bowel
obstruction. No free air. There are small phleboliths in pelvis.
IMPRESSION: Paucity of bowel gas. While this finding may be seen normally, it
may represent enteritis or early ileus. Bowel obstruction not felt
to be likely. No free air. Lungs clear.

## 2018-03-05 ENCOUNTER — Telehealth: Payer: Self-pay | Admitting: Internal Medicine

## 2018-03-05 NOTE — Telephone Encounter (Signed)
New Message:       *STAT* If patient is at the pharmacy, call can be transferred to refill team.   1. Which medications need to be refilled? (please list name of each medication and dose if known)  diltiazem (CARDIZEM) 60 MG tablet  flecainide (TAMBOCOR) 100 MG tablet  2. Which pharmacy/location (including street and city if local pharmacy) is medication to be sent [facility] [address] [address]  3. Do they need a 30 day or 90 day supply? 30

## 2018-03-05 NOTE — Telephone Encounter (Signed)
Pt has not been seen since 2017, pt has an appt in September. Would Dr. Johney FrameAllred like to refill pt's medications, diltiazem and flecainide.  please address

## 2018-03-08 NOTE — Telephone Encounter (Signed)
LVM with pt. Would like to clarify how he is taking his medication and the amount he is requesting.

## 2018-03-12 MED ORDER — FLECAINIDE ACETATE 100 MG PO TABS: ORAL_TABLET | ORAL | 1 refills | Status: DC

## 2018-03-12 MED ORDER — DILTIAZEM HCL 60 MG PO TABS: ORAL_TABLET | ORAL | 0 refills | Status: DC

## 2018-03-12 NOTE — Telephone Encounter (Signed)
Refilled for 30 days 

## 2018-04-23 ENCOUNTER — Encounter: Payer: Self-pay | Admitting: Internal Medicine

## 2018-05-07 ENCOUNTER — Ambulatory Visit (INDEPENDENT_AMBULATORY_CARE_PROVIDER_SITE_OTHER): Payer: 59 | Admitting: Internal Medicine

## 2018-05-07 ENCOUNTER — Encounter: Payer: Self-pay | Admitting: Internal Medicine

## 2018-05-07 VITALS — BP 130/82 | HR 70 | Ht 70.0 in | Wt 213.2 lb

## 2018-05-07 DIAGNOSIS — G4733 Obstructive sleep apnea (adult) (pediatric): Secondary | ICD-10-CM | POA: Diagnosis not present

## 2018-05-07 DIAGNOSIS — I4891 Unspecified atrial fibrillation: Secondary | ICD-10-CM

## 2018-05-07 DIAGNOSIS — Z23 Encounter for immunization: Secondary | ICD-10-CM | POA: Diagnosis not present

## 2018-05-07 MED ORDER — DILTIAZEM HCL 60 MG PO TABS: ORAL_TABLET | ORAL | 3 refills | Status: AC

## 2018-05-07 MED ORDER — DILTIAZEM HCL 60 MG PO TABS: ORAL_TABLET | ORAL | 3 refills | Status: DC

## 2018-05-07 MED ORDER — FLECAINIDE ACETATE 100 MG PO TABS: ORAL_TABLET | ORAL | 3 refills | Status: DC

## 2018-05-07 MED ORDER — FLECAINIDE ACETATE 100 MG PO TABS: ORAL_TABLET | ORAL | 3 refills | Status: AC

## 2018-05-07 NOTE — Progress Notes (Signed)
   PCP: Patient, No Pcp Per   Primary EP: Dr Johney FrameAllred  Noah Bell is a 42 y.o. male who presents today for routine electrophysiology followup.  Since last being seen in our clinic, the patient reports doing very well.  His children are 2 and 4.  He is very active with them but does not formally exercise.  His afib is rare.  He has only required pill in pocket flecainide once in the past year. Today, he denies symptoms of palpitations, chest pain, shortness of breath,  lower extremity edema, dizziness, presyncope, or syncope.  The patient is otherwise without complaint today.   Past Medical History:  Diagnosis Date  . Atrial fibrillation (HCC) 07/01/12   converted w/ Flecainide in the ED  . Exercise-induced asthma   . Hyperthyroidism   . Irritable bowel syndrome   . Lyme disease 01/2012  . Sleep apnea 2015   not severe enough for CPAP,  dental appliance recommended   Past Surgical History:  Procedure Laterality Date  . None      ROS- all systems are reviewed and negatives except as per HPI above  Current Outpatient Medications  Medication Sig Dispense Refill  . diltiazem (CARDIZEM) 60 MG tablet Take 60 mg by mouth at onset of palpitations 30 tablet 0  . EPIPEN 2-PAK 0.3 MG/0.3ML DEVI Inject 0.3 mg into the muscle once. For anaphylaxis    . esomeprazole (NEXIUM) 40 MG capsule Take 1 capsule by mouth daily.    . flecainide (TAMBOCOR) 100 MG tablet Take 3 tablets by mouth at onset of palpitations. 12 tablet 1  . levothyroxine (SYNTHROID, LEVOTHROID) 175 MCG tablet Take 175 mcg by mouth as directed.   2  . loperamide (IMODIUM) 2 MG capsule Take 1 capsule (2 mg total) by mouth as needed for diarrhea or loose stools (up to 16mg  per day). 20 capsule 0   No current facility-administered medications for this visit.     Physical Exam: Vitals:   05/07/18 1630  BP: 130/82  Pulse: 70  SpO2: 96%  Weight: 213 lb 3.2 oz (96.7 kg)  Height: 5\' 10"  (1.778 m)    GEN- The patient is well  appearing, alert and oriented x 3 today.   Head- normocephalic, atraumatic Eyes-  Sclera clear, conjunctiva pink Ears- hearing intact Oropharynx- clear Lungs- Clear to ausculation bilaterally, normal work of breathing Heart- Regular rate and rhythm, no murmurs, rubs or gallops, PMI not laterally displaced GI- soft, NT, ND, + BS Extremities- no clubbing, cyanosis, or edema  Wt Readings from Last 3 Encounters:  05/07/18 213 lb 3.2 oz (96.7 kg)  01/21/17 207 lb (93.9 kg)  09/28/15 205 lb 6.4 oz (93.2 kg)    EKG tracing ordered today is personally reviewed and shows sinus rhythm 70 bpm, PR 128 msec, QRS 94 msec, QTc 427 msec  Assessment and Plan:  1. afib Well controlled chads2vasc score is 0 He has pill in pocket flecianide  2. Mild OSA Stable No change required today  Return to see EP NP in a year  Hillis RangeJames Tamim Skog MD, [facility] CenterFACC 05/07/2018 4:54 PM

## 2018-05-07 NOTE — Patient Instructions (Addendum)
Medication Instructions:  Your physician recommends that you continue on your current medications as directed. Please refer to the Current Medication list given to you today.  Labwork: None ordered.  Testing/Procedures: None ordered.  Follow-Up:  Your physician wants you to follow-up in: one year with Amber Seiler, NP.   You will receive a reminder letter in the mail two months in advance. If you don't receive a letter, please call our office to schedule the follow-up appointment.   Any Other Special Instructions Will Be Listed Below (If Applicable).  If you need a refill on your cardiac medications before your next appointment, please call your pharmacy.
